# Patient Record
Sex: Female | Born: 1975 | Race: White | Hispanic: No | Marital: Married | Smoking: Never smoker
Health system: Southern US, Community
[De-identification: ages and names within clinical notes are randomized; demographics above are authoritative.]

## PROBLEM LIST (undated history)

## (undated) DIAGNOSIS — Z8619 Personal history of other infectious and parasitic diseases: Secondary | ICD-10-CM

## (undated) DIAGNOSIS — F329 Major depressive disorder, single episode, unspecified: Secondary | ICD-10-CM

## (undated) DIAGNOSIS — F419 Anxiety disorder, unspecified: Secondary | ICD-10-CM

## (undated) DIAGNOSIS — Z8489 Family history of other specified conditions: Secondary | ICD-10-CM

## (undated) DIAGNOSIS — F32A Depression, unspecified: Secondary | ICD-10-CM

## (undated) HISTORY — DX: Personal history of other infectious and parasitic diseases: Z86.19

---

## 1997-09-18 ENCOUNTER — Other Ambulatory Visit: Admission: RE | Admit: 1997-09-18 | Discharge: 1997-09-18 | Payer: Self-pay | Admitting: *Deleted

## 1998-11-04 ENCOUNTER — Other Ambulatory Visit: Admission: RE | Admit: 1998-11-04 | Discharge: 1998-11-04 | Payer: Self-pay | Admitting: Obstetrics and Gynecology

## 1999-12-20 ENCOUNTER — Emergency Department (HOSPITAL_COMMUNITY): Admission: EM | Admit: 1999-12-20 | Discharge: 1999-12-20 | Payer: Self-pay | Admitting: Emergency Medicine

## 2002-04-12 HISTORY — PX: CHOLECYSTECTOMY: SHX55

## 2002-08-10 ENCOUNTER — Inpatient Hospital Stay (HOSPITAL_COMMUNITY): Admission: AD | Admit: 2002-08-10 | Discharge: 2002-08-10 | Payer: Self-pay | Admitting: Obstetrics and Gynecology

## 2003-01-18 ENCOUNTER — Ambulatory Visit (HOSPITAL_COMMUNITY): Admission: RE | Admit: 2003-01-18 | Discharge: 2003-01-18 | Payer: Self-pay | Admitting: Obstetrics and Gynecology

## 2003-02-01 ENCOUNTER — Encounter: Payer: Self-pay | Admitting: Obstetrics and Gynecology

## 2003-02-01 ENCOUNTER — Ambulatory Visit (HOSPITAL_COMMUNITY): Admission: RE | Admit: 2003-02-01 | Discharge: 2003-02-01 | Payer: Self-pay | Admitting: Obstetrics and Gynecology

## 2003-03-14 ENCOUNTER — Inpatient Hospital Stay (HOSPITAL_COMMUNITY): Admission: AD | Admit: 2003-03-14 | Discharge: 2003-03-18 | Payer: Self-pay | Admitting: Obstetrics and Gynecology

## 2003-03-15 ENCOUNTER — Encounter (INDEPENDENT_AMBULATORY_CARE_PROVIDER_SITE_OTHER): Payer: Self-pay | Admitting: *Deleted

## 2003-04-22 ENCOUNTER — Other Ambulatory Visit: Admission: RE | Admit: 2003-04-22 | Discharge: 2003-04-22 | Payer: Self-pay | Admitting: Obstetrics and Gynecology

## 2003-05-24 ENCOUNTER — Encounter: Admission: RE | Admit: 2003-05-24 | Discharge: 2003-05-24 | Payer: Self-pay | Admitting: Family Medicine

## 2003-06-26 ENCOUNTER — Encounter (INDEPENDENT_AMBULATORY_CARE_PROVIDER_SITE_OTHER): Payer: Self-pay | Admitting: Specialist

## 2003-06-26 ENCOUNTER — Observation Stay (HOSPITAL_COMMUNITY): Admission: RE | Admit: 2003-06-26 | Discharge: 2003-06-27 | Payer: Self-pay | Admitting: General Surgery

## 2004-06-08 ENCOUNTER — Other Ambulatory Visit: Admission: RE | Admit: 2004-06-08 | Discharge: 2004-06-08 | Payer: Self-pay | Admitting: Obstetrics and Gynecology

## 2004-09-29 ENCOUNTER — Emergency Department (HOSPITAL_COMMUNITY): Admission: EM | Admit: 2004-09-29 | Discharge: 2004-09-29 | Payer: Self-pay | Admitting: Emergency Medicine

## 2005-04-23 ENCOUNTER — Ambulatory Visit: Payer: Self-pay | Admitting: Family Medicine

## 2005-07-06 ENCOUNTER — Ambulatory Visit: Payer: Self-pay | Admitting: Family Medicine

## 2006-03-24 ENCOUNTER — Inpatient Hospital Stay (HOSPITAL_COMMUNITY): Admission: AD | Admit: 2006-03-24 | Discharge: 2006-03-27 | Payer: Self-pay | Admitting: Obstetrics and Gynecology

## 2006-06-28 ENCOUNTER — Ambulatory Visit: Payer: Self-pay | Admitting: Family Medicine

## 2009-04-26 ENCOUNTER — Inpatient Hospital Stay (HOSPITAL_COMMUNITY): Admission: AD | Admit: 2009-04-26 | Discharge: 2009-04-27 | Payer: Self-pay | Admitting: Obstetrics and Gynecology

## 2009-11-18 ENCOUNTER — Inpatient Hospital Stay (HOSPITAL_COMMUNITY)
Admission: RE | Admit: 2009-11-18 | Discharge: 2009-11-21 | Payer: Self-pay | Source: Home / Self Care | Admitting: Obstetrics and Gynecology

## 2010-06-26 LAB — CBC
HCT: 31.1 % — ABNORMAL LOW (ref 36.0–46.0)
HCT: 35.9 % — ABNORMAL LOW (ref 36.0–46.0)
Hemoglobin: 12.4 g/dL (ref 12.0–15.0)
MCH: 32.7 pg (ref 26.0–34.0)
MCH: 33.2 pg (ref 26.0–34.0)
MCHC: 34.7 g/dL (ref 30.0–36.0)
MCHC: 35.2 g/dL (ref 30.0–36.0)
MCV: 94.2 fL (ref 78.0–100.0)
Platelets: 135 10*3/uL — ABNORMAL LOW (ref 150–400)
RBC: 3.81 MIL/uL — ABNORMAL LOW (ref 3.87–5.11)
RDW: 13.3 % (ref 11.5–15.5)
RDW: 13.6 % (ref 11.5–15.5)
WBC: 8.3 10*3/uL (ref 4.0–10.5)

## 2010-06-26 LAB — SURGICAL PCR SCREEN
MRSA, PCR: NEGATIVE
Staphylococcus aureus: NEGATIVE

## 2010-06-26 LAB — TYPE AND SCREEN: Antibody Screen: NEGATIVE

## 2010-06-26 LAB — RPR: RPR Ser Ql: NONREACTIVE

## 2010-06-26 LAB — ABO/RH: ABO/RH(D): A POS

## 2010-06-28 LAB — COMPREHENSIVE METABOLIC PANEL
AST: 15 U/L (ref 0–37)
Albumin: 3.3 g/dL — ABNORMAL LOW (ref 3.5–5.2)
BUN: 13 mg/dL (ref 6–23)
Calcium: 8.8 mg/dL (ref 8.4–10.5)
Chloride: 105 mEq/L (ref 96–112)
Creatinine, Ser: 0.47 mg/dL (ref 0.4–1.2)
GFR calc Af Amer: >60 mL/min (ref >60)
Total Bilirubin: 1 mg/dL (ref 0.3–1.2)
Total Protein: 6.3 g/dL (ref 6.0–8.3)

## 2010-06-28 LAB — URINALYSIS, ROUTINE W REFLEX MICROSCOPIC
Bilirubin Urine: NEGATIVE
Glucose, UA: NEGATIVE mg/dL
Hgb urine dipstick: NEGATIVE
Specific Gravity, Urine: >1.03 — ABNORMAL HIGH (ref 1.005–1.030)
pH: 5 (ref 5.0–8.0)

## 2010-06-28 LAB — CBC
Hemoglobin: 11.9 g/dL — ABNORMAL LOW (ref 12.0–15.0)
MCHC: 34.1 g/dL (ref 30.0–36.0)
MCV: 92.7 fL (ref 78.0–100.0)
MCV: 93.5 fL (ref 78.0–100.0)
Platelets: 146 10*3/uL — ABNORMAL LOW (ref 150–400)
RBC: 3.73 MIL/uL — ABNORMAL LOW (ref 3.87–5.11)
RDW: 12.8 % (ref 11.5–15.5)
WBC: 10.3 10*3/uL (ref 4.0–10.5)
WBC: 11.2 10*3/uL — ABNORMAL HIGH (ref 4.0–10.5)

## 2010-08-27 ENCOUNTER — Encounter: Payer: Self-pay | Admitting: Family Medicine

## 2010-08-27 ENCOUNTER — Ambulatory Visit (INDEPENDENT_AMBULATORY_CARE_PROVIDER_SITE_OTHER): Payer: PRIVATE HEALTH INSURANCE | Admitting: Family Medicine

## 2010-08-27 ENCOUNTER — Ambulatory Visit: Payer: Self-pay | Admitting: Internal Medicine

## 2010-08-27 DIAGNOSIS — H6090 Unspecified otitis externa, unspecified ear: Secondary | ICD-10-CM | POA: Insufficient documentation

## 2010-08-27 DIAGNOSIS — H60399 Other infective otitis externa, unspecified ear: Secondary | ICD-10-CM

## 2010-08-27 DIAGNOSIS — Z0000 Encounter for general adult medical examination without abnormal findings: Secondary | ICD-10-CM | POA: Insufficient documentation

## 2010-08-27 MED ORDER — OFLOXACIN 0.3 % OT SOLN: 10.0000 [drp] | Freq: Every day | OTIC | Status: AC

## 2010-08-27 NOTE — Assessment & Plan Note (Signed)
Treat with otic ear drops.  If too expensive, would change to cipro HC or ciprodex.

## 2010-08-27 NOTE — Assessment & Plan Note (Addendum)
Reviewed preventive protocols. Due for tetanus next year. Return fasting for blood work. Gets well woman by Dr. Vincente Poli.

## 2010-08-27 NOTE — Patient Instructions (Signed)
You have outer ear infection. Treat with ibuprofen/advil or tylenol and ear drops (sent to pharmacy). Return at your convenience fasting for blood work. Good to meet you today. If worsening, we may want to take a look again.

## 2010-08-27 NOTE — Progress Notes (Signed)
  Subjective:    Patient ID: Brittney [name], female    DOB: 22-Jul-1975, 35 y.o.   MRN: [id number]  HPI CC: new patient, establish, ear pain  1 mo h/o ear irritation.  Started with muffled hearing.  Feels like something clogged in ears like water.  Now swollen and hurting.  Hurts to open mouth to eat.  Bilateral.  Pain to touch.  No draining or discharge.  + hearing changes both sides.  No tinnitus, n/v/dizziness.  Not recently swimming.  Never had ear infection in past.  Prevetative: Last tetanus 2003. No recent blood work. Well woman at [facility].  Last pap, breast 05/2010, normal.  Medications and allergies reviewed and updated in chart. PMHx, SurgHx, SHx and FMHx updated in chart.  Review of Systems  Constitutional: Negative for fever, chills, activity change, appetite change, fatigue and unexpected weight change.  HENT: Positive for hearing loss and ear pain. Negative for neck pain.   Eyes: Negative for visual disturbance.  Respiratory: Negative for cough, choking, chest tightness, shortness of breath and wheezing.   Cardiovascular: Negative for chest pain, palpitations and leg swelling.  Gastrointestinal: Negative for nausea, vomiting, abdominal pain, diarrhea, constipation, blood in stool and abdominal distention.  Genitourinary: Negative for hematuria and difficulty urinating.  Musculoskeletal: Negative for myalgias and arthralgias.  Skin: Negative for rash.  Neurological: Negative for dizziness, seizures, syncope and headaches.  Hematological: Does not bruise/bleed easily.  Psychiatric/Behavioral: Negative for dysphoric mood. The patient is not nervous/anxious.        Objective:   Physical Exam  Nursing note and vitals reviewed. Constitutional: She is oriented to person, place, and time. She appears well-developed and well-nourished. No distress.  HENT:  Head: Normocephalic and atraumatic.  Right Ear: Hearing and tympanic membrane normal. There is swelling  and tenderness. Tympanic membrane is not perforated.  Left Ear: Hearing and tympanic membrane normal. There is swelling and tenderness. Tympanic membrane is not perforated.  Nose: Nose normal.  Mouth/Throat: Oropharynx is clear and moist.       Dry skin external right pinna. Erythema/maceration bilateral external ear canals. Hearing intact. L cerumen impaction, disimpaction with curette performed.  Eyes: Conjunctivae and EOM are normal. Pupils are equal, round, and reactive to light.  Neck: Normal range of motion. Neck supple. No thyromegaly present.  Cardiovascular: Normal rate, regular rhythm, normal heart sounds and intact distal pulses.   No murmur heard. Pulses:      Radial pulses are 2+ on the right side, and 2+ on the left side.  Pulmonary/Chest: Effort normal and breath sounds normal. No respiratory distress. She has no wheezes. She has no rales.  Abdominal: Soft. Bowel sounds are normal. She exhibits no distension and no mass. There is no tenderness. There is no rebound and no guarding.  Musculoskeletal: Normal range of motion.  Lymphadenopathy:    She has no cervical adenopathy.  Neurological: She is alert and oriented to person, place, and time.       CN grossly intact, station and gait intact  Skin: Skin is warm and dry. No rash noted.  Psychiatric: She has a normal mood and affect. Her behavior is normal. Judgment and thought content normal.          Assessment & Plan:

## 2010-08-28 ENCOUNTER — Other Ambulatory Visit (INDEPENDENT_AMBULATORY_CARE_PROVIDER_SITE_OTHER): Payer: PRIVATE HEALTH INSURANCE | Admitting: Family Medicine

## 2010-08-28 DIAGNOSIS — Z0000 Encounter for general adult medical examination without abnormal findings: Secondary | ICD-10-CM

## 2010-08-28 DIAGNOSIS — Z13220 Encounter for screening for lipoid disorders: Secondary | ICD-10-CM

## 2010-08-28 DIAGNOSIS — H60399 Other infective otitis externa, unspecified ear: Secondary | ICD-10-CM

## 2010-08-28 DIAGNOSIS — H6090 Unspecified otitis externa, unspecified ear: Secondary | ICD-10-CM

## 2010-08-28 LAB — LDL CHOLESTEROL, DIRECT: Direct LDL: 155.5 mg/dL

## 2010-08-28 LAB — CBC WITH DIFFERENTIAL/PLATELET
Basophils Absolute: 0 10*3/uL (ref 0.0–0.1)
Eosinophils Relative: 1.4 % (ref 0.0–5.0)
HCT: 38.4 % (ref 36.0–46.0)
Hemoglobin: 13.1 g/dL (ref 12.0–15.0)
Lymphocytes Relative: 24.7 % (ref 12.0–46.0)
Lymphs Abs: 1.8 10*3/uL (ref 0.7–4.0)
Monocytes Relative: 7.5 % (ref 3.0–12.0)
Neutro Abs: 4.8 10*3/uL (ref 1.4–7.7)
Platelets: 161 10*3/uL (ref 150.0–400.0)
RDW: 13 % (ref 11.5–14.6)
WBC: 7.3 10*3/uL (ref 4.5–10.5)

## 2010-08-28 LAB — TSH: TSH: 1.88 u[IU]/mL (ref 0.35–5.50)

## 2010-08-28 LAB — LIPID PANEL
Cholesterol: 203 mg/dL — ABNORMAL HIGH (ref 0–200)
Total CHOL/HDL Ratio: 5
VLDL: 34.8 mg/dL (ref 0.0–40.0)

## 2010-08-28 LAB — BASIC METABOLIC PANEL
CO2: 27 mEq/L (ref 19–32)
Chloride: 104 mEq/L (ref 96–112)
Potassium: 4.6 mEq/L (ref 3.5–5.1)

## 2010-08-28 NOTE — Op Note (Signed)
[name], [name]                       ACCOUNT NO.:  [id number]   MEDICAL RECORD NO.:  [id number]                   PATIENT TYPE:  OBV   LOCATION:  0278                                 FACILITY:  [facility]   PHYSICIAN:  Gita Kudo, M.D.              DATE OF BIRTH:  1976-03-21   DATE OF PROCEDURE:  06/26/2003  DATE OF DISCHARGE:  06/27/2003                                 OPERATIVE REPORT   OPERATIVE PROCEDURE:  Laparoscopic cholecystectomy, unsuccessful attempt at  [facility].   SURGEON:  Gita Kudo, M.D.   ASSISTANT:  Currie Paris, M.D.   ANESTHESIA:  General endotracheal.   PREOPERATIVE DIAGNOSIS:  Multiple small gallstones, normal liver function  studies.   POSTOPERATIVE DIAGNOSES:  1. Multiple small gallstones, normal liver function studies.  2. Very small cystic duct.   CLINICAL SUMMARY:  A 35 year old female with symptoms of abdominal pain in  the right upper quadrant.  Gallbladder ultrasound shows stones.  Liver  function studies are normal.   OPERATIVE FINDINGS:  The patient was very heavy, and exposure was somewhat  difficult, but very good eventually.  Her liver was enlarged and had fatty  deposits.  The gallbladder itself looked normal.  The cystic duct was quite  small, and I could not cannulate it for x-ray.   OPERATIVE PROCEDURE:  Under satisfactory general endotracheal anesthesia,  the patient's abdomen was prepped and draped in a standard fashion.  She  received 1.0 g Ancef preop.  A total of 30 mL of 0.5% Marcaine with  epinephrine was infiltrated before the skin incisions were made.  Transverse  incision made above the umbilicus and the deep retractor was used to  identify the fascia which was opened into the peritoneum.  Midline  controlled with a figure-of-eight 0 Vicryl suture and operating Hasson port  inserted, secured.  Good CO2 pneumoperitoneum was established and then  camera placed.  Under direct visualization, two #5 ports placed  laterally  and a second #10 medially.  With exposure being afforded by lateral  graspers, operating through the medial port, I carefully dissected the  distal gallbladder cystic duct junction.  When certain of the anatomy by  circumferentially dissection, a clip placed near the gallbladder.  An  incision made in the cystic duct, and attempts to pass a percutaneous  catheter were unsuccessful and cholangiogram not obtained.  The duct was  controlled with multiple clips and divided.  Likewise, the artery was  divided between multiple clips and divided.  Then, the gallbladder was  dissected from below upward.  Another large posterior artery to the  gallbladder was identified and controlled with clips and divided, and the  gallbladder was removed from the liver bed with the cautery clip for  dissection and hemostasis.  A small hole made in the gallbladder, and clear  bile came out.  No stones.  The was placed in an EndoCatch bag after the  dissection, and the remainder of that liter of saline used to irrigate the  operative site and abdomen.   Camera then moved to the upper port and a grasper in the lower port through  the umbilicus was used to extract the gallbladder intact and without further  spillage or problem.  Then, a second liter of saline was used to irrigate  the operative site, suctioned away.   The ports were removed under direct vision.  The operative site checked for  hemostasis which was good.  There were no apparent complications, and the  patient went to the recovery room from the operating room in good condition  without complication.                                               Gita Kudo, M.D.    MRL/MEDQ  D:  06/26/2003  T:  06/27/2003  Job:  [id number]   cc:   L. Lupe Carney, M.D.  [address] [place]  [place] [address]  Fax: [phone]

## 2010-08-28 NOTE — Op Note (Signed)
[name], Brittney [name]             ACCOUNT NO.:  [id number]   MEDICAL RECORD NO.:  [id number]          PATIENT TYPE:  INP   LOCATION:  9130                          FACILITY:  [facility]   PHYSICIAN:  Michelle L. Grewal, M.D.DATE OF BIRTH:  [birth year]   DATE OF PROCEDURE:  03/24/2006  DATE OF DISCHARGE:                               OPERATIVE REPORT   PREOPERATIVE DIAGNOSIS:  Intrauterine pregnancy at 39 weeks and previous  cesarean section.   POSTOPERATIVE DIAGNOSIS:  Intrauterine pregnancy at 39 weeks and  previous cesarean section.   PROCEDURE:  Repeat low transverse cesarean section.   SURGEON:  Michelle L. Vincente Poli, M.D.   ANESTHESIA:  Spinal and epidural.   SPECIMEN:  Female infant, Apgars 9 at 1 minute and 9 at 5 minutes.   ESTIMATED BLOOD LOSS:  500 mL.   COMPLICATIONS:  None.   DESCRIPTION OF PROCEDURE:  The patient was taken to the operating room.  Her epidural and spinal were placed by Dr. Jean Rosenthal.  She is prepped and  draped in the usual sterile fashion.  A Foley catheter is inserted. A  low transverse incision was made and carried down to the fascia.  The  fascia was scored in the midline and extended laterally.  The rectus  muscles were separated in the midline.  The peritoneum was entered  bluntly.  The peritoneal incision was then stretched.  The bladder blade  was inserted and the lower uterine segment was identified. The bladder  flap was created sharply and then digitally.  The bladder blade was then  readjusted. A low transverse incision was made in the uterus.  The  uterus was entered using a hemostat.  The baby was in cephalic  presentation and was delivered with the assistance of the vacuum  extractor without difficulty.  She was a female infant, Apgar 9 at 1  minute and 9 at 5 minutes.  The cord was clamped and cut. The baby was  handed to the awaiting pediatrician.  The placenta was manually removed  and noted be normal, intact, with a three vessel cord.   The uterus was  exteriorized and cleared of all clots and debris.  The uterine incision  was closed in one layer using 0 chromic in a continuous running locked  stitch.  Hemostasis was excellent.  The uterus was returned to the  abdomen.  Irrigation was performed.  Hemostasis was again excellent.  The peritoneum and rectus muscles were reapproximated using 0 Vicryl.  The fascia was closed using 0 Vicryl in a running stitch starting at  each corner and meeting in the midline.  After irrigation of the subcutaneous layer, the subcutaneous layer was  closed with 0 plain gut interrupted and the skin was closed with  staples.  All sponge, lap and instrument counts were correct x2.  The  patient went to recovery room in stable condition.      Michelle L. Vincente Poli, M.D.  Electronically Signed     MLG/MEDQ  D:  03/24/2006  T:  03/24/2006  Job:  [id number]

## 2010-08-28 NOTE — Discharge Summary (Signed)
Brittney [name], Brittney [name]             ACCOUNT NO.:  [id number]   MEDICAL RECORD NO.:  [id number]          PATIENT TYPE:  INP   LOCATION:  9130                          FACILITY:  [facility]   PHYSICIAN:  Guy Sandifer. Henderson Cloud, M.D. DATE OF BIRTH:  July 23, 1975   DATE OF ADMISSION:  03/24/2006  DATE OF DISCHARGE:  03/27/2006                               DISCHARGE SUMMARY   ADMITTING DIAGNOSES:  1. Intrauterine pregnancy.  2. Previous cesarean section, desires repeat.   DISCHARGE DIAGNOSES:  1. Intrauterine pregnancy at 39 weeks.  2. Previous cesarean section, desires repeat.  3. Postpartum depression.   REASON FOR ADMISSION:  The patient is 35 year old married white female  G2 P1 with an EDC of April 03, 2006 who has had a previous cesarean  section.  After discussion of the options, she is admitted for repeat.  Prenatal care was also complicated by history of anxiety and depression  treated with Zoloft in the past.  The patient states she has been off  Zoloft for essentially the entire pregnancy.   HOSPITAL COURSE:  The patient is admitted to hospital, taken to  operating room, undergoes the above procedure.  It is productive of a  viable female infant, Apgars of 9 and 9 at one and five minutes  respectively.  The first postoperative day vital signs are stable and  she is afebrile.  Hemoglobin is 11.4, white count 9.3, platelets  159,000.  She has a good resumption of bowel function and has good pain  control.  She does notice the return of some sadness and teariness  consistent with her history of postpartum depression.  No thoughts of  harming herself or the baby.  This happened with her previous pregnancy.  Options are carefully discussed with the patient and she will resume  Zoloft 50 mg daily at home.  She has the prescription at home.   CONDITION ON DISCHARGE:  Good.   DIET:  Regular as tolerated.   ACTIVITY:  No lifting, no operation of automobiles, no vaginal entry.   FOLLOW  UP:  She is to follow up in the office in 2 weeks.   MEDICATIONS:  1. Percocet 5/325 mg #40 one to two p.o. q.6 h. p.r.n.  2. Ibuprofen 600 mg q.6 h. p.r.n.  3. Prenatal vitamin daily.  4. Zoloft 50 mg daily.      Guy Sandifer Henderson Cloud, M.D.  Electronically Signed     JET/MEDQ  D:  03/27/2006  T:  03/27/2006  Job:  [id number]

## 2010-08-28 NOTE — Op Note (Signed)
[name], [name]                       ACCOUNT NO.:  [id number]   MEDICAL RECORD NO.:  [id number]                   PATIENT TYPE:  INP   LOCATION:  9126                                 FACILITY:  [facility]   PHYSICIAN:  Dineen Kid. Rana Snare, M.D.                 DATE OF BIRTH:  [birth year]   DATE OF PROCEDURE:  03/16/2003  DATE OF DISCHARGE:                                 OPERATIVE REPORT   PREOPERATIVE DIAGNOSES:  1. Intrauterine pregnancy at 38-1/2 weeks.  2. Presumed macrosomia.  3. Failure to progress.  4. Fetal intolerance to labor.   POSTOPERATIVE DIAGNOSES:  1. Intrauterine pregnancy at 38-1/2 weeks.  2. Presumed macrosomia.  3. Failure to progress.  4. Fetal intolerance to labor.  5. Occiput posterior presentation.   PROCEDURE:  Primary low segment transverse cesarean section.   SURGEON:  Dineen Kid. Rana Snare, M.D.   ANESTHESIA:  Epidural.   INDICATIONS:  Brittney [name] is a 35 year old G1 at 38-1/2 weeks who presented  with spontaneous rupture of membranes in early labor.  She progressed to 4  cm dilated and despite Pitocin was unable to achieve adequate labor pattern,  progressed only to 4 cm, 75% effaced, and -2 station.  The patient did not  tolerate labor and after approximately five to six hours of 4 cm dilated  unable to achieve adequate contractions despite 28 milliunits of Pitocin per  minute.  Due to fetal intolerance to labor, proceeded wit primary low  segment transverse cesarean section.  Risks and benefits were discussed,  informed consent was obtained.   The findings at the time of surgery were a viable female infant, Apgars were  9 and 9.  The pH arterial was 7.32 and the weight was 8 pounds 6 ounces.   DESCRIPTION OF PROCEDURE:  After adequate analgesia, the patient was placed  in the supine position with left lateral tilt.  She was sterilely prepped  and draped.  The bladder was sterilely drained with a Foley catheter.  A  Pfannenstiel skin incision was made  two fingerbreadths over the pubic  symphysis, taken down sharply to the fascia, incised transversely, and  extended superiorly and inferiorly of the bellies of the rectus muscles.  The peritoneum was entered sharply.  A bladder flap was created, placed  behind the bladder blade.  A low segment myotomy incision was made down to  the infant's vertex, extended laterally with the operator's fingertips.  The  infant's vertex was delivered atraumatically.  The nares and pharynx were  suctioned.  The infant was then delivered, the cord clamped and cut, and  handed to the pediatricians for resuscitation.  Cord blood was entertained,  the placenta extracted manually.  The uterus was exteriorized, wiped clean  with a dry lap.  The myotomy incision was closed in two layers, the first  being a running locking layer, the second being an imbricating layer of 0  Monocryl  suture.  The uterus was placed back in the peritoneal cavity and  after a copious amount of irrigation, adequate hemostasis was assured.  The  peritoneum was closed with 0 Monocryl in a running fashion, the rectus  muscles plicated in the midline.  Irrigation was applied and after adequate  hemostasis, the fascia was closed with a single suture of double-stranded 0  PDS in a running fashion with good approximation and good hemostasis.  Irrigation was applied and after adequate hemostasis, the  Camper's fascia  was reapproximated with interrupted sutures of 0 plain, staples were then  applied.  The skin was approximated with staples with good approximation and  good hemostasis.  The patient tolerated the procedure well, was stable on  transfer to the recovery room.  The sponge, instrument, and needle count was  normal x3.  The patient received 2 g of Cefotetan after delivery of the  placenta and will stay on this for 24 hours due to her size.  The estimated  blood loss was 800 mL.                                               Dineen Kid  Rana Snare, M.D.    DCL/MEDQ  D:  03/15/2003  T:  03/16/2003  Job:  [id number]

## 2010-08-28 NOTE — Discharge Summary (Signed)
Brittney [name]                       ACCOUNT NO.:  [id number]   MEDICAL RECORD NO.:  [id number]                   PATIENT TYPE:  INP   LOCATION:  9126                                 FACILITY:  [facility]   PHYSICIAN:  Guy Sandifer. Henderson Cloud, M.D.              DATE OF BIRTH:  [birth year]   DATE OF ADMISSION:  03/14/2003  DATE OF DISCHARGE:  03/18/2003                                 DISCHARGE SUMMARY   ADMITTING DIAGNOSES:  1. Intrauterine pregnancy at 77 weeks estimated gestational age.  2. Spontaneous rupture of membranes.   DISCHARGE DIAGNOSES:  1. Status post low transverse cesarean section secondary to macrosomia.  2. Failure to progress.  3. Fetal intolerance to labor.  4. Viable female infant.   PROCEDURE:  Primary low transverse cesarean section.   REASON FOR ADMISSION:  Please see written H&P.   HOSPITAL COURSE:  The patient was a 35 year old white married female  primigravida that was admitted to [facility] after  spontaneous rupture of membranes at term.  The patient was previously  scheduled for a two-stage induction for macrosomia.  Cervix was noted to be  2-3 cm dilated, vertex, at a -4 station.  IV Pitocin was started with  anticipation of a vaginal delivery.  Epidural was later placed for the  patient's comfort.  Vital signs were stable.  Fetal heart tones were noted  in the 140s with no deceleration.  Contractions were noted to be every 2-3  minutes.  Cervix did progress to 4 cm dilated, 75% effaced, at a -2 station.  After several hours of labor the patient's cervix was unchanged.  Fetal  heart tones were noted to have late decelerations which were repetitive  which did respond to discontinuation of Pitocin, oxygen, and increased IV  fluids.  Due to fetal intolerance of labor and no further change in cervical  dilatation a decision was made to proceed with a cesarean delivery.  The  patient was then transferred to the operating room where epidural  was dosed  to an adequate surgical level.  A low transverse incision was made with the  delivery of a viable female infant weighing 8 pounds 6 ounces with Apgars of  9 at one minute and 9 at five minutes.  Umbilical cord pH was 7.32.  The  patient tolerated the procedure well and was taken to the recovery room in  stable condition.  On postoperative day #1 vital signs were stable with good  return of bowel function.  Fundus was firm and nontender.  Abdominal  dressing was clean, dry, and intact.  Labs revealed hemoglobin of 11.4.  On  postoperative day #2 the patient was without complaint.  Vital signs were  stable; she remained afebrile.  Fundus was firm and nontender.  Abdominal  dressing had been removed revealing an incision that was clean, dry, and  intact.  On postoperative day #3 the patient did complain of  some low back  pain.  She denied nausea and vomiting.  She had good bowel function.  The  patient was catheterized for urinalysis and C&S which subsequently was  negative.  Abdomen was soft, fundus was firm, incision was clean, dry, and  intact.  Staples were removed and the patient was discharged home.   CONDITION ON DISCHARGE:  Good.   DIET:  Regular as tolerated.   ACTIVITY:  No heavy lifting, no driving x2 weeks, no vaginal entry.   FOLLOW-UP:  The patient is to follow up in the office in one week for an  incision check.  She is to call for temperature greater than 100 degree,  persistent nausea and vomiting, heavy vaginal bleeding, and/or redness or  drainage from the incisional site.   DISCHARGE MEDICATIONS:  1. Tylox #30 one p.o. q.4-6h. p.r.n. pain.  2. Motrin 600 mg q.6h. p.r.n.  3. Prenatal vitamins one p.o. daily.  4. Colace one p.o. daily p.r.n.     Julio Sicks, N.P.                        Guy Sandifer. Henderson Cloud, M.D.    CC/MEDQ  D:  04/16/2003  T:  04/16/2003  Job:  [id number]

## 2011-10-20 ENCOUNTER — Ambulatory Visit (INDEPENDENT_AMBULATORY_CARE_PROVIDER_SITE_OTHER): Payer: PRIVATE HEALTH INSURANCE | Admitting: Family Medicine

## 2011-10-20 ENCOUNTER — Encounter: Payer: Self-pay | Admitting: Family Medicine

## 2011-10-20 VITALS — BP 118/80 | HR 103 | Temp 98.4°F | Ht 63.0 in | Wt 300.8 lb

## 2011-10-20 DIAGNOSIS — M722 Plantar fascial fibromatosis: Secondary | ICD-10-CM

## 2011-10-20 NOTE — Patient Instructions (Addendum)

## 2011-10-20 NOTE — Progress Notes (Signed)
The patient presents with a 2-3 week long history of heel pain. This is notable for worsening pain first thing in the morning when arising and standing after sitting. Heel has really sharpt pains in it. First thing in the morning, will hurt really bad.   Prior foot or ankle fractures: none Prior operations: none Orthotics or bracing: none Medications: none PT or home rehab: none Night splints: no Ice massage: no Ball massage: no  Metatarsal pain: no  The PMH, PSH, Social History, Family History, Medications, and allergies have been reviewed in [facility], and have been updated if relevant.  REVIEW OF SYSTEMS  GEN: No fevers, chills. Nontoxic. Primarily MSK c/o today. MSK: Detailed in the HPI GI: tolerating PO intake without difficulty Neuro: No numbness, parasthesias, or tingling associated. Otherwise the pertinent positives of the ROS are noted above.   PHYSICAL EXAM  Blood pressure 118/80, pulse 103, temperature 98.4 F (36.9 C), temperature source Oral, height 5\' 3"  (1.6 m), weight 300 lb 12 oz (136.419 kg), SpO2 96.00%.  GEN: Well-developed,well-nourished,in no acute distress; alert,appropriate and cooperative throughout examination HEENT: Normocephalic and atraumatic without obvious abnormalities. Ears, externally no deformities PULM: Breathing comfortably in no respiratory distress EXT: No clubbing, cyanosis, or edema PSYCH: Normally interactive. Cooperative during the interview. Pleasant. Friendly and conversant. Not anxious or depressed appearing. Normal, full affect.  LEFT FOOT Echymosis: no Edema: no ROM: full LE B Gait: heel toe, non-antalgic MT pain: no Callus pattern: none Lateral Mall: NT Medial Mall: NT Talus: NT Navicular: NT Calcaneous: NT Metatarsals: NT 5th MT: NT Phalanges: NT Achilles: NT Plantar Fascia: tender, medial along PF. Pain with forced dorsi Fat Pad: NT Peroneals: NT Post Tib: NT Great Toe: Nml motion Ant Drawer: neg Other foot  breakdown: none Long arch: preserved Transverse arch: preserved Hindfoot breakdown: none Sensation: intact  A/P: Plantar fascitis:  >25 minutes spent in face to face time with patient, >50% spent in counselling or coordination of care: anatomy and care and rehab reviewed. We reviewed that stretching is critically important to the treatment of PF. Reviewed footwear. Rigid soles have been shown to help with PF.  Refer to the patient instructions sections for details of plan shared with patient.

## 2011-12-28 ENCOUNTER — Ambulatory Visit (INDEPENDENT_AMBULATORY_CARE_PROVIDER_SITE_OTHER): Payer: PRIVATE HEALTH INSURANCE | Admitting: Family Medicine

## 2011-12-28 ENCOUNTER — Encounter: Payer: Self-pay | Admitting: Family Medicine

## 2011-12-28 VITALS — BP 126/84 | HR 72 | Temp 97.9°F | Wt 296.2 lb

## 2011-12-28 DIAGNOSIS — H5789 Other specified disorders of eye and adnexa: Secondary | ICD-10-CM | POA: Insufficient documentation

## 2011-12-28 MED ORDER — OLOPATADINE HCL 0.1 % OP SOLN: 1.0000 [drp] | Freq: Two times a day (BID) | OPHTHALMIC | Status: DC

## 2011-12-28 MED ORDER — CIPROFLOXACIN HCL 0.3 % OP SOLN: OPHTHALMIC | Status: DC

## 2011-12-28 NOTE — Patient Instructions (Addendum)
I am somewhat worried about superficial scratch of eye. No contacts or makeup. Treat with over the counter lubricating eye drops and antibiotic eye drop - sent to pharmacy. Don't use patanol drops. Return tomorrow for recheck.  Corneal Abrasion The cornea is the clear covering at the front and center of the eye. When looking at the colored portion (iris) of the eye, you are looking through that person's cornea.  This very thin tissue is made up of many layers. The surface layer is a single layer of cells called the corneal epithelium. This is one of the most sensitive tissues in the body. If a scratch or injury causes the corneal epithelium to come off, it is called a corneal abrasion. If the injury extends to the tissues below the epithelium, the condition is called a corneal ulcer.  CAUSES   Scratches.   Trauma.   Foreign body in the eye.   Some people have recurrences of abrasions in the area of the original injury even after they heal. This is called recurrent erosion syndrome. Recurrent erosion syndromes generally improve and go away with time.  SYMPTOMS   Eye pain.   Difficulty or inability to keep the injured eye open.   The eye becomes very sensitive to light.   Recurrent erosions tend to happen suddenly, first thing in the morning - usually upon awakening and opening the eyes.  DIAGNOSIS  Your eye professional can diagnose a corneal abrasion during an eye exam. Dye is usually placed in the eye using a drop or a small paper strip moistened by the patient's tears. When the eye is examined with a special light, the abrasion shows up clearly because of the dye. TREATMENT   Small abrasions may be treated with antibiotic drops or ointment alone.   Usually a pressure patch is specially applied. Pressure patches prevent the eye from blinking, allowing the corneal epithelium to heal. Because blinking is less, a pressure patch also reduces the amount of pain present in the eye during  healing. Most corneal abrasions heal within 2-3 days with no effect on vision. WARNING: Do not drive or operate machinery while your eye is patched. Your ability to judge distances is impaired.   If abrasion becomes infected and spreads to the deeper tissues of the cornea, a corneal ulcer can result. This is serious because it can cause corneal scarring. Corneal scars interfere with light passing through the cornea, and cause a loss of vision in the involved eye.   If your caregiver has given you a follow-up appointment, it is very important to keep that appointment. Not keeping the appointment could result in a severe eye infection or permanent loss of vision. If there is any problem keeping the appointment, you must call back to this facility for assistance.  SEEK MEDICAL CARE IF:   You have pain, light sensitivity and a scratchy feeling in one eye (or both).   Your pressure patch keeps loosening up and you can blink your eye under the patch after treatment.   Any kind of discharge develops from the involved eye after treatment or if the lids stick together in the morning.   You have the same symptoms in the morning as you did with the original abrasion days, weeks or months after the abrasion healed.  MAKE SURE YOU:   Understand these instructions.   Will watch your condition.   Will get help right away if you are not doing well or get worse.  Document Released: 03/26/2000  Document Revised: 03/18/2011 Document Reviewed: 11/02/2007 [facility] Patient Information 2012 [place], [place].

## 2011-12-28 NOTE — Assessment & Plan Note (Signed)
Anticipate R allergic conjunctivitis, however with + flurescein cannot r/o corneal abrasion in contact wearer. Treat with cipro drops for next 5 days, rtc tomorrow for recheck.  If not improving, will refer to ophtho. Pt agrees with plan. Lubricating eye drops recommended OTC.  See pt instructions.

## 2011-12-28 NOTE — Progress Notes (Signed)
  Subjective:    Patient ID: Brittney [name], female    DOB: May 28, 1975, 36 y.o.   MRN: [id number]  HPI CC: R red eye  3-4 d h/o R red eye.  Started when working in yard.  Had contacts in.  Had itchy eye, scratched it.  Wore contacts all day next 2 days, then removed them.  Wore makeup yesterday which seemed to make issue worse.  No vision changes.  No pain with eye movements.  No fevers/chills, congestion, viral sxs, sneezing, RN. No other sick contacts with red eyes at home.  No h/o allergies.  Past Medical History  Diagnosis Date  . History of chicken pox      Review of Systems Per HPI    Objective:   Physical Exam  Nursing note and vitals reviewed. Constitutional: She appears well-developed and well-nourished. No distress.  Eyes: EOM and lids are normal. Pupils are equal, round, and reactive to light. Right conjunctiva is injected. Right conjunctiva has no hemorrhage. Left conjunctiva is not injected. Left conjunctiva has no hemorrhage. Right eye exhibits normal extraocular motion and no nystagmus. Left eye exhibits normal extraocular motion and no nystagmus.  Slit lamp exam:      The right eye shows fluorescein uptake.       Right bulbar conjunctiva injected, erythematous. + fluorescein uptake right eye at lateral limbus. Swollen conjunctiva noted there.       Assessment & Plan:

## 2012-03-06 ENCOUNTER — Other Ambulatory Visit: Payer: Self-pay | Admitting: Obstetrics and Gynecology

## 2012-03-14 ENCOUNTER — Telehealth: Payer: Self-pay | Admitting: Family Medicine

## 2012-03-14 NOTE — Telephone Encounter (Signed)
Patient Information:  Caller Name: Brittney [name]  Phone: [phone]  Patient: Brittney [name], Brittney [name]  Gender: Female  DOB: 1975-08-25  Age: 36 Years  PCP: Brittney [name] [facility])  Pregnant: No   Symptoms  Reason For Call & Symptoms: plantar fascitis; seen by Dr. Patsy Lager, and did receive cortisone in the past which helped.  Reviewed Health History In EMR: Yes  Reviewed Medications In EMR: Yes  Reviewed Allergies In EMR: Yes  Reviewed Surgeries / Procedures: Yes  Date of Onset of Symptoms: Unknown OB:  LMP: Unknown  Guideline(s) Used:  Foot Pain  Disposition Per Guideline:   See Within 3 Days in Office  Reason For Disposition Reached:   Moderate pain (e.g., interferes with normal activities, limping) and present > 3 days  Advice Given:  N/A  Office Follow Up:  Does the office need to follow up with this patient?: No  Instructions For The Office: N/A  Appointment Scheduled:  03/16/2012 08:15:00 Appointment Scheduled Provider:  Hannah Beat (Family Practice)  RN Note:  Patient prefers to get cortisone injection in the office.  Denies new swelling or redness of the foot.  States received cortisone shot about 3 months ago.  Seen by Dr. Patsy Lager in July 2013 and given foot exercises to do.  Per protocol, emergent symptoms denied; advised appt within 3 days.  Appt scheduled 03/512 0815 with Dr. Patsy Lager.  krs/can

## 2012-03-16 ENCOUNTER — Ambulatory Visit (INDEPENDENT_AMBULATORY_CARE_PROVIDER_SITE_OTHER): Payer: PRIVATE HEALTH INSURANCE | Admitting: Family Medicine

## 2012-03-16 ENCOUNTER — Encounter: Payer: Self-pay | Admitting: Family Medicine

## 2012-03-16 VITALS — BP 120/78 | HR 93 | Temp 97.9°F | Ht 63.0 in | Wt 301.0 lb

## 2012-03-16 DIAGNOSIS — M722 Plantar fascial fibromatosis: Secondary | ICD-10-CM

## 2012-03-16 NOTE — Progress Notes (Signed)
[facility] at [facility] [address] [place] [address] Phone: [phone] Fax: [phone]  Date:  03/16/2012   Name:  Brittney [name]   DOB:  February 10, 1976   MRN:  [id number] Gender: female Age: 36 y.o.  PCP:  Eustaquio Boyden, MD  Evaluating MD: Hannah Beat, MD   Chief Complaint: Foot Pain   History of Present Illness:  Brittney [name] is a 36 y.o. pleasant patient who presents with the following:  Left PF: PF ongoing for 6 months on the L, no trauma or accident. Has been doing some rehab, stretching. Weight = 300 lbs. Had corticosteroid injection 3 months ago from podiatry which helped until just now.   Patient Active Problem List  Diagnosis  . Routine general medical examination at a health care facility  . Otitis external  . Redness of right eye    Past Medical History  Diagnosis Date  . History of chicken pox     Past Surgical History  Procedure Date  . Cholecystectomy 2004  . Cesarean section     x3 (2004, 2007, 2011)    History  Substance Use Topics  . Smoking status: Never Smoker   . Smokeless tobacco: Never Used  . Alcohol Use: Yes     Comment: Rare    Family History  Problem Relation Age of Onset  . Diabetes Sister   . Polycystic ovary syndrome Sister   . Hypertension Sister   . Cancer Maternal Grandfather 46    prostate, stomach  . Stroke Paternal Grandmother   . Coronary artery disease Neg Hx     No Known Allergies  Medication list has been reviewed and updated.  Outpatient Prescriptions Prior to Visit  Medication Sig Dispense Refill  . [DISCONTINUED] ciprofloxacin (CILOXAN) 0.3 % ophthalmic solution Administer 1 drop, every 2 hours, while awake, for 1 day. Then 1 drop, every 4 hours, while awake, for the next 5 days.  5 mL  0   Last reviewed on 03/16/2012  8:16 AM by Consuello Masse, CMA  Review of Systems:   GEN: No fevers, chills. Nontoxic. Primarily MSK c/o today. MSK: Detailed in the HPI GI:  tolerating PO intake without difficulty Neuro: No numbness, parasthesias, or tingling associated. Otherwise the pertinent positives of the ROS are noted above.    Physical Examination: Filed Vitals:   03/16/12 0816  BP: 120/78  Pulse: 93  Temp: 97.9 F (36.6 C)  TempSrc: Oral  Height: 5\' 3"  (1.6 m)  Weight: 301 lb (136.533 kg)  SpO2: 97%    Body mass index is 53.32 kg/(m^2). Ideal Body Weight: Weight in (lb) to have BMI = 25: 140.8    GEN: WDWN, NAD, Non-toxic, Alert & Oriented x 3 HEENT: Atraumatic, Normocephalic.  Ears and Nose: No external deformity. EXTR: No clubbing/cyanosis/edema NEURO: Normal gait.  PSYCH: Normally interactive. Conversant. Not depressed or anxious appearing.  Calm demeanor.   Foot exam, LEFT Echymosis: no Edema: no ROM: full LE B Gait: heel toe, non-antalgic MT pain: no Callus pattern: none Lateral Mall: NT Medial Mall: NT Talus: NT Navicular: NT Calcaneous: NT Metatarsals: NT 5th MT: NT Phalanges: NT Achilles: NT Plantar Fascia: tender, medial along PF. Pain with forced dorsi Fat Pad: NT Peroneals: NT Post Tib: NT Great Toe: Nml motion Ant Drawer: neg Other foot breakdown: none Long arch: preserved Transverse arch: preserved Hindfoot breakdown: none Sensation: intact   Assessment and Plan:  1. Plantar fascia syndrome    Reviewed classic rehab  Plantar Fascitis Injection Verbal consent obtained. Risks including risk of rupture, hypopigmentation, and infection reviewed in addition to benefits and alternatives were reviewed. Chloraprep used for prep. Ethyl Chloride used for anesthesia. Under sterile conditions, using the medial approach 1 cc of Lidocaine 1% and 1 cc of Depo-Medrol 40 mg injected superior to plantar fascia and fanned. No compications. Decreased pain post-injection.   Orders Today:  No orders of the defined types were placed in this encounter.    Updated Medication List: (Includes new medications, updates to  list, dose adjustments) No orders of the defined types were placed in this encounter.    Medications Discontinued: Medications Discontinued During This Encounter  Medication Reason  . ciprofloxacin (CILOXAN) 0.3 % ophthalmic solution Error     Hannah Beat, MD

## 2012-06-28 ENCOUNTER — Ambulatory Visit (INDEPENDENT_AMBULATORY_CARE_PROVIDER_SITE_OTHER): Payer: PRIVATE HEALTH INSURANCE | Admitting: Family Medicine

## 2012-06-28 ENCOUNTER — Encounter: Payer: Self-pay | Admitting: Family Medicine

## 2012-06-28 VITALS — BP 116/78 | HR 94 | Temp 99.0°F | Ht 63.0 in | Wt 276.8 lb

## 2012-06-28 DIAGNOSIS — J029 Acute pharyngitis, unspecified: Secondary | ICD-10-CM | POA: Insufficient documentation

## 2012-06-28 LAB — POCT RAPID STREP A (OFFICE): Rapid Strep A Screen: NEGATIVE

## 2012-06-28 MED ORDER — GUAIFENESIN-CODEINE 100-10 MG/5ML PO SYRP: 5.0000 mL | ORAL_SOLUTION | Freq: Every evening | ORAL | Status: DC | PRN

## 2012-06-28 NOTE — Assessment & Plan Note (Signed)
RST negative. Anticipate viral - discussed this as well as supportive care Treat with ibuprofen, cheratussin for cough. If tightness sensation not better with ibuprofen and cough suppression, treat with prednisone course. Pt agrees with plan, aware to call if no better.
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
## 2012-06-28 NOTE — Patient Instructions (Addendum)
You have pharyngitis. Push fluids and plenty of rest. May use ibuprofen 400-600mg  up to three times a day for throat inflammation (take with food). Salt water gargles. Suck on cold things like popsicles or warm things like herbal teas (whichever soothes the throat better). Return if fever >101.5, worsening pain, or trouble opening/closing mouth, or hoarse voice. Good to see you today, call clinic with questions. Cough medication at night. If not better with above, call me for steroid course.

## 2012-08-06 ENCOUNTER — Encounter (HOSPITAL_BASED_OUTPATIENT_CLINIC_OR_DEPARTMENT_OTHER): Payer: Self-pay | Admitting: *Deleted

## 2012-08-06 ENCOUNTER — Emergency Department (HOSPITAL_BASED_OUTPATIENT_CLINIC_OR_DEPARTMENT_OTHER): Payer: PRIVATE HEALTH INSURANCE

## 2012-08-06 ENCOUNTER — Emergency Department (HOSPITAL_BASED_OUTPATIENT_CLINIC_OR_DEPARTMENT_OTHER)
Admission: EM | Admit: 2012-08-06 | Discharge: 2012-08-06 | Disposition: A | Discharge status: Home / Self Care | Payer: PRIVATE HEALTH INSURANCE | Attending: Emergency Medicine | Admitting: Emergency Medicine

## 2012-08-06 DIAGNOSIS — R112 Nausea with vomiting, unspecified: Secondary | ICD-10-CM | POA: Insufficient documentation

## 2012-08-06 DIAGNOSIS — R0789 Other chest pain: Secondary | ICD-10-CM | POA: Insufficient documentation

## 2012-08-06 DIAGNOSIS — Z79899 Other long term (current) drug therapy: Secondary | ICD-10-CM | POA: Insufficient documentation

## 2012-08-06 DIAGNOSIS — R000 Tachycardia, unspecified: Secondary | ICD-10-CM | POA: Insufficient documentation

## 2012-08-06 DIAGNOSIS — R059 Cough, unspecified: Secondary | ICD-10-CM | POA: Insufficient documentation

## 2012-08-06 DIAGNOSIS — R0602 Shortness of breath: Secondary | ICD-10-CM | POA: Insufficient documentation

## 2012-08-06 DIAGNOSIS — R509 Fever, unspecified: Secondary | ICD-10-CM | POA: Insufficient documentation

## 2012-08-06 DIAGNOSIS — R05 Cough: Secondary | ICD-10-CM | POA: Insufficient documentation

## 2012-08-06 DIAGNOSIS — J3489 Other specified disorders of nose and nasal sinuses: Secondary | ICD-10-CM | POA: Insufficient documentation

## 2012-08-06 DIAGNOSIS — R0982 Postnasal drip: Secondary | ICD-10-CM | POA: Insufficient documentation

## 2012-08-06 DIAGNOSIS — R55 Syncope and collapse: Secondary | ICD-10-CM

## 2012-08-06 DIAGNOSIS — R42 Dizziness and giddiness: Secondary | ICD-10-CM | POA: Insufficient documentation

## 2012-08-06 DIAGNOSIS — Z8619 Personal history of other infectious and parasitic diseases: Secondary | ICD-10-CM | POA: Insufficient documentation

## 2012-08-06 DIAGNOSIS — Z3202 Encounter for pregnancy test, result negative: Secondary | ICD-10-CM | POA: Insufficient documentation

## 2012-08-06 DIAGNOSIS — J029 Acute pharyngitis, unspecified: Secondary | ICD-10-CM | POA: Insufficient documentation

## 2012-08-06 LAB — CBC WITH DIFFERENTIAL/PLATELET
Basophils Absolute: 0 10*3/uL (ref 0.0–0.1)
Eosinophils Relative: 0 % (ref 0–5)
Lymphocytes Relative: 12 % (ref 12–46)
Neutro Abs: 3.8 10*3/uL (ref 1.7–7.7)
Neutrophils Relative %: 72 % (ref 43–77)
Platelets: 127 10*3/uL — ABNORMAL LOW (ref 150–400)
RBC: 4.49 MIL/uL (ref 3.87–5.11)
RDW: 12.8 % (ref 11.5–15.5)
WBC: 5.2 10*3/uL (ref 4.0–10.5)

## 2012-08-06 LAB — BASIC METABOLIC PANEL
CO2: 29 mEq/L (ref 19–32)
Calcium: 9.4 mg/dL (ref 8.4–10.5)
Chloride: 104 mEq/L (ref 96–112)
Potassium: 4.4 mEq/L (ref 3.5–5.1)
Sodium: 141 mEq/L (ref 135–145)

## 2012-08-06 LAB — URINALYSIS, ROUTINE W REFLEX MICROSCOPIC
Leukocytes, UA: NEGATIVE
Nitrite: NEGATIVE
Specific Gravity, Urine: 1.019 (ref 1.005–1.030)
Urobilinogen, UA: 0.2 mg/dL (ref 0.0–1.0)
pH: 5.5 (ref 5.0–8.0)

## 2012-08-06 LAB — PREGNANCY, URINE: Preg Test, Ur: NEGATIVE

## 2012-08-06 MED ORDER — ONDANSETRON HCL 4 MG/2ML IJ SOLN: 4.0000 mg | Freq: Once | INTRAMUSCULAR | Status: DC

## 2012-08-06 MED ORDER — HYDROMORPHONE HCL PF 1 MG/ML IJ SOLN: 1.0000 mg | Freq: Once | INTRAMUSCULAR | Status: DC

## 2012-08-06 MED ORDER — SODIUM CHLORIDE 0.9 % IV BOLUS (SEPSIS)
1000.0000 mL | Freq: Once | INTRAVENOUS | Status: AC
  Administered 2012-08-06: 1000 mL via INTRAVENOUS

## 2012-08-06 MED ORDER — ONDANSETRON 8 MG PO TBDP
8.0000 mg | ORAL_TABLET | Freq: Once | ORAL | Status: AC
  Administered 2012-08-06: 8 mg via ORAL
  Filled 2012-08-06: qty 1

## 2012-08-06 MED ORDER — AZITHROMYCIN 250 MG PO TABS: ORAL_TABLET | ORAL | Status: DC

## 2012-08-06 NOTE — ED Provider Notes (Signed)
History     CSN: [id number]  Arrival date & time 08/06/12  1257   First MD Initiated Contact with Patient 08/06/12 1444      Chief Complaint  Patient presents with  . Loss of Consciousness    (Consider location/radiation/quality/duration/timing/severity/associated sxs/prior treatment) The history is provided by the patient.    Brittney [name] is a 37 y.o. female who presents to the ED with syncopal episode. She states that she has had cough cold and congestion for 5 days and has gotten progressively worse. Today she went and took a shower and when finishing felt dizzy. She called her husband and he came and put his arms around her and she passed out. She did not fall or hit her head. He had her and helped her to the floor. The incident lasted approximately 10 seconds. After that she vomited yellow/green bile. Since then she continues to feel dizzy and have nausea. When symptoms occur she feels clammy and tingling.  She only has chest pain with cough.    Past Medical History  Diagnosis Date  . History of chicken pox     Past Surgical History  Procedure Laterality Date  . Cholecystectomy  2004  . Cesarean section      x3 (2004, 2007, 2011)    Family History  Problem Relation Age of Onset  . Diabetes Sister   . Polycystic ovary syndrome Sister   . Hypertension Sister   . Cancer Maternal Grandfather 82    prostate, stomach  . Stroke Paternal Grandmother   . Coronary artery disease Neg Hx     History  Substance Use Topics  . Smoking status: Never Smoker   . Smokeless tobacco: Never Used  . Alcohol Use: Yes     Comment: Rare    OB History   Grav Para Term Preterm Abortions TAB SAB Ect Mult Living                  Review of Systems  Constitutional: Positive for fever and chills.  HENT: Positive for congestion, sore throat and postnasal drip. Negative for ear pain and sinus pressure.   Eyes: Negative for visual disturbance.  Respiratory: Positive for cough,  chest tightness and shortness of breath.   Cardiovascular: Negative for chest pain and palpitations.  Gastrointestinal: Positive for nausea and vomiting. Negative for abdominal pain and diarrhea.  Genitourinary: Negative for dysuria, frequency and vaginal bleeding.  Skin: Negative for rash.  Neurological: Positive for syncope. Negative for light-headedness and headaches.  Psychiatric/Behavioral: Negative for confusion. The patient is not nervous/anxious.     Allergies  Review of patient's allergies indicates no known allergies.  Home Medications   Current Outpatient Rx  Name  Route  Sig  Dispense  Refill  . sertraline (ZOLOFT) 50 MG tablet   Oral   Take 50 mg by mouth daily.         Marland Kitchen azithromycin (ZITHROMAX Z-PAK) 250 MG tablet      Take 2 tablets today and then one tablet daily   6 tablet   0   . guaiFENesin-codeine (ROBITUSSIN AC) 100-10 MG/5ML syrup   Oral   Take 5 mLs by mouth at bedtime as needed for cough.   180 mL   0   . PARoxetine (PAXIL) 10 MG tablet   Oral   Take 10 mg by mouth daily.           BP 111/68  Pulse 87  Temp(Src) 98.7 F (37.1 C) (Oral)  Resp 23  Ht 5' 3.5" (1.613 m)  Wt 269 lb (122.018 kg)  BMI 46.9 kg/m2  SpO2 99%  Physical Exam  Nursing note and vitals reviewed. Constitutional: She is oriented to person, place, and time. She appears distressed.  Morbidly obese W/F  HENT:  Head: Normocephalic and atraumatic.  Right Ear: Tympanic membrane normal.  Left Ear: Tympanic membrane normal.  Nose: Rhinorrhea present. Right sinus exhibits no maxillary sinus tenderness and no frontal sinus tenderness. Left sinus exhibits no maxillary sinus tenderness and no frontal sinus tenderness.  Mouth/Throat: Uvula is midline and mucous membranes are normal. Posterior oropharyngeal erythema present.  Eyes: Conjunctivae and EOM are normal. Pupils are equal, round, and reactive to light.  Neck: Normal range of motion. Neck supple.  Cardiovascular:  Tachycardia present.   Pulmonary/Chest: Effort normal and breath sounds normal. No respiratory distress.  Abdominal: Soft. Bowel sounds are normal. There is no tenderness.  Musculoskeletal: Normal range of motion.  Neurological: She is alert and oriented to person, place, and time. She has normal strength. No cranial nerve deficit or sensory deficit. Coordination and gait normal.  Skin: Skin is warm and dry.  Psychiatric: She has a normal mood and affect. Her behavior is normal. Judgment and thought content normal.   ED Course  Procedures (including critical care time)  Assessment: 37 y.o. female with syncopal episode   Bronchitis  Plan:  Symptomatic treatment   Continue cough medication  MDM  Labs normal. EKG: {ekg findings:315101::"normal EKG, normal sinus rhythm", I have discussed this case with Dr. Charlann Boxer patient is stable for discharge home and follow up with her PCP. She will return if symptoms return.    Medication List    TAKE these medications       azithromycin 250 MG tablet  Commonly known as:  ZITHROMAX Z-PAK  Take 2 tablets today and then one tablet daily      ASK your doctor about these medications       guaiFENesin-codeine 100-10 MG/5ML syrup  Commonly known as:  ROBITUSSIN AC  Take 5 mLs by mouth at bedtime as needed for cough.     PARoxetine 10 MG tablet  Commonly known as:  PAXIL  Take 10 mg by mouth daily.     sertraline 50 MG tablet  Commonly known as:  ZOLOFT  Take 50 mg by mouth daily.            [facility] Orlene Och, [place] 08/07/12 1550

## 2012-08-06 NOTE — ED Notes (Addendum)
Pt states that everyone at work has either bronchitis or pneumonia. Thursday developed cough and sore throat. Syncopal episode PTA while in shower. A&O at present. No neuro deficits noted.

## 2012-08-06 NOTE — ED Notes (Signed)
Pt began to feel nauseated. Given Zofran.

## 2012-08-08 ENCOUNTER — Ambulatory Visit (INDEPENDENT_AMBULATORY_CARE_PROVIDER_SITE_OTHER): Payer: PRIVATE HEALTH INSURANCE | Admitting: Family Medicine

## 2012-08-08 ENCOUNTER — Encounter: Payer: Self-pay | Admitting: Family Medicine

## 2012-08-08 VITALS — BP 118/68 | HR 88 | Temp 99.5°F | Ht 63.0 in | Wt 269.8 lb

## 2012-08-08 DIAGNOSIS — J209 Acute bronchitis, unspecified: Secondary | ICD-10-CM

## 2012-08-08 NOTE — Assessment & Plan Note (Signed)
Rev ER notes/ studies and labs in detail -overall some improved but still weak and pre syncopal at times with a bad cough  Vitals and exam reassuring Will finish zpak/ take rob AC prn and rest-out of work 2 d Update if not starting to improve in a week or if worsening

## 2012-08-08 NOTE — ED Provider Notes (Signed)
History/physical exam/procedure(s) were performed by non-physician practitioner and as supervising physician I was immediately available for consultation/collaboration. I have reviewed all notes and am in agreement with care and plan.   Megahn Killings S Jemaine Prokop, MD 08/08/12 1139 

## 2012-08-08 NOTE — Patient Instructions (Addendum)
Drink lots of fluids  If you need a cough medicine refill- please call  Rest / take care of yourself  Out of work today and tomorrow Update if not starting to improve in a week or if worsening  -especially if high fever or worse cough

## 2012-08-08 NOTE — Progress Notes (Signed)
Subjective:    Patient ID: Brittney [name], female    DOB: 06-28-1975, 37 y.o.   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 Not enough     At ER labs  Lab Results  Component Value Date   WBC 5.2 08/06/2012   HGB 14.1 08/06/2012   HCT 41.8 08/06/2012   MCV 93.1 08/06/2012   PLT 127* 08/06/2012     Chemistry      Component Value Date/Time   NA 141 08/06/2012 1428   K 4.4 08/06/2012 1428   CL 104 08/06/2012 1428   CO2 29 08/06/2012 1428   BUN 9 08/06/2012 1428   CREATININE 0.70 08/06/2012 1428      Component Value Date/Time   CALCIUM 9.4 08/06/2012 1428   ALKPHOS 54 04/26/2009 1247   AST 15 04/26/2009 1247   ALT 10 04/26/2009 1247   BILITOT 1.0 04/26/2009 1247      cxr showed bronchitis EKG showed nsr  Given zithromax -no problems with that   Patient Active Problem List   Diagnosis Date Noted  . Viral pharyngitis 06/28/2012  . Redness of right eye 12/28/2011  . Routine general medical examination at a health care facility 08/27/2010   Past Medical History  Diagnosis Date  . History of chicken pox    Past Surgical History  Procedure Laterality Date  . Cholecystectomy  2004  . Cesarean section      x3 (2004, 2007, 2011)   History  Substance Use Topics  . Smoking status: Never Smoker   .  Smokeless tobacco: Never Used  . Alcohol Use: Yes     Comment: Rare   Family History  Problem Relation Age of Onset  . Diabetes Sister   . Polycystic ovary syndrome Sister   . Hypertension Sister   . Cancer Maternal Grandfather 24    prostate, stomach  . Stroke Paternal Grandmother   . Coronary artery disease Neg Hx    No Known Allergies Current Outpatient Prescriptions on File Prior to Visit  Medication Sig Dispense Refill  . guaiFENesin-codeine (ROBITUSSIN AC) 100-10 MG/5ML syrup Take 5 mLs by mouth at bedtime as needed for cough.  180 mL  0  . sertraline (ZOLOFT) 50 MG tablet Take 50 mg by mouth daily.       No current facility-administered medications on file prior to visit.    Review of Systems Review of Systems  Constitutional: Negative for unexpected weight change.  ENT pos for cong/ rhinorrhea and post nasal drip Eyes: Negative for pain and visual disturbance.  Respiratory: Negative for shortness of breath.  pos for persistent cough Cardiovascular: Negative for cp or palpitations    Gastrointestinal: Negative for nausea, diarrhea and  constipation.  Genitourinary: Negative for urgency and frequency.  Skin: Negative for pallor or rash   Neurological: Negative for weakness, light-headedness, numbness and headaches.  Hematological: Negative for adenopathy. Does not bruise/bleed easily.  Psychiatric/Behavioral: Negative for dysphoric mood. The patient is not nervous/anxious.         Objective:   Physical Exam  Constitutional: She appears well-developed and well-nourished. No distress.  obese and well appearing   HENT:  Head: Normocephalic and atraumatic.  Right Ear: External ear normal.  Left Ear: External ear normal.  Mouth/Throat: Oropharynx is clear and moist. No oropharyngeal exudate.  Nares are injected and congested  No sinus tenderness   Eyes: Conjunctivae and EOM are normal. Pupils are equal, round, and reactive to light. Right eye exhibits no discharge.  Left eye exhibits no discharge. No scleral icterus.  Neck: Normal range of motion. Neck supple. No JVD present. Carotid bruit is not present. No thyromegaly present.  Cardiovascular: Normal rate, regular rhythm, normal heart sounds and intact distal pulses.  Exam reveals no gallop.   No murmur heard. Pulmonary/Chest: Effort normal and breath sounds normal. No respiratory distress. She has no wheezes. She has no rales. She exhibits no tenderness.  Harsh hacking cough  No wheeze  Good air exch No crackles or rales Few scattered rhonchi  Abdominal: Soft. Bowel sounds are normal. She exhibits no distension, no abdominal bruit and no mass. There is no tenderness.  Musculoskeletal: She exhibits no edema.  Lymphadenopathy:    She has no cervical adenopathy.  Neurological: She is alert. She has normal reflexes. No cranial nerve deficit. She exhibits normal muscle tone. Coordination normal.  Skin: Skin is warm and dry. No rash noted. No erythema. No pallor.  Psychiatric: She has a normal mood and affect.          Assessment & Plan:

## 2012-08-21 ENCOUNTER — Other Ambulatory Visit: Payer: Self-pay | Admitting: Family Medicine

## 2012-08-21 NOTE — Telephone Encounter (Signed)
Ok to refill? Seen by Dr. Karie Schwalbe for bronchitis on 08/08/12 and was told if she needed refill on this med to call.

## 2012-08-21 NOTE — Telephone Encounter (Signed)
plz phone in. 

## 2012-08-22 NOTE — Telephone Encounter (Signed)
Rx called in as directed.   

## 2013-03-22 ENCOUNTER — Other Ambulatory Visit: Payer: Self-pay | Admitting: Obstetrics and Gynecology

## 2014-04-26 ENCOUNTER — Other Ambulatory Visit: Payer: Self-pay | Admitting: Obstetrics and Gynecology

## 2014-04-29 LAB — CYTOLOGY - PAP

## 2017-06-15 LAB — HM PAP SMEAR

## 2017-06-21 ENCOUNTER — Other Ambulatory Visit: Payer: Self-pay

## 2017-06-21 ENCOUNTER — Encounter (HOSPITAL_COMMUNITY): Payer: Self-pay | Admitting: *Deleted

## 2017-07-18 ENCOUNTER — Encounter (HOSPITAL_COMMUNITY)
Admission: AD | Disposition: A | Discharge status: Home / Self Care | Payer: Self-pay | Source: Ambulatory Visit | Attending: Obstetrics and Gynecology

## 2017-07-18 ENCOUNTER — Ambulatory Visit (HOSPITAL_COMMUNITY): Payer: No Typology Code available for payment source | Admitting: Certified Registered Nurse Anesthetist

## 2017-07-18 ENCOUNTER — Encounter (HOSPITAL_COMMUNITY): Payer: Self-pay

## 2017-07-18 ENCOUNTER — Ambulatory Visit (HOSPITAL_COMMUNITY)
Admission: AD | Admit: 2017-07-18 | Discharge: 2017-07-18 | Disposition: A | Discharge status: Home / Self Care | Payer: No Typology Code available for payment source | Source: Ambulatory Visit | Attending: Obstetrics and Gynecology | Admitting: Obstetrics and Gynecology

## 2017-07-18 ENCOUNTER — Ambulatory Visit (HOSPITAL_COMMUNITY): Payer: No Typology Code available for payment source

## 2017-07-18 ENCOUNTER — Other Ambulatory Visit: Payer: Self-pay

## 2017-07-18 DIAGNOSIS — Z6841 Body Mass Index (BMI) 40.0 and over, adult: Secondary | ICD-10-CM | POA: Insufficient documentation

## 2017-07-18 DIAGNOSIS — Z419 Encounter for procedure for purposes other than remedying health state, unspecified: Secondary | ICD-10-CM

## 2017-07-18 DIAGNOSIS — Z975 Presence of (intrauterine) contraceptive device: Secondary | ICD-10-CM | POA: Insufficient documentation

## 2017-07-18 DIAGNOSIS — Z30430 Encounter for insertion of intrauterine contraceptive device: Secondary | ICD-10-CM | POA: Diagnosis present

## 2017-07-18 DIAGNOSIS — Z538 Procedure and treatment not carried out for other reasons: Secondary | ICD-10-CM | POA: Insufficient documentation

## 2017-07-18 HISTORY — DX: Family history of other specified conditions: Z84.89

## 2017-07-18 HISTORY — DX: Depression, unspecified: F32.A

## 2017-07-18 HISTORY — PX: INTRAUTERINE DEVICE (IUD) INSERTION: SHX5877

## 2017-07-18 HISTORY — PX: HYSTEROSCOPY WITH D & C: SHX1775

## 2017-07-18 HISTORY — DX: Major depressive disorder, single episode, unspecified: F32.9

## 2017-07-18 HISTORY — DX: Anxiety disorder, unspecified: F41.9

## 2017-07-18 LAB — CBC
HCT: 41.6 % (ref 36.0–46.0)
Hemoglobin: 14.2 g/dL (ref 12.0–15.0)
MCH: 31.7 pg (ref 26.0–34.0)
MCHC: 34.1 g/dL (ref 30.0–36.0)
MCV: 92.9 fL (ref 78.0–100.0)
PLATELETS: 178 10*3/uL (ref 150–400)
RBC: 4.48 MIL/uL (ref 3.87–5.11)
RDW: 12.3 % (ref 11.5–15.5)
WBC: 6.8 10*3/uL (ref 4.0–10.5)

## 2017-07-18 LAB — PREGNANCY, URINE: PREG TEST UR: NEGATIVE

## 2017-07-18 SURGERY — DILATATION AND CURETTAGE /HYSTEROSCOPY
Anesthesia: General

## 2017-07-18 MED ORDER — SODIUM CHLORIDE 0.9 % IV SOLN
3.0000 g | INTRAVENOUS | Status: AC
  Administered 2017-07-18: 3 g via INTRAVENOUS
  Filled 2017-07-18: qty 3

## 2017-07-18 MED ORDER — LIDOCAINE HCL (CARDIAC) 20 MG/ML IV SOLN
INTRAVENOUS | Status: DC | PRN
  Administered 2017-07-18: 100 mg via INTRAVENOUS

## 2017-07-18 MED ORDER — ONDANSETRON HCL 4 MG/2ML IJ SOLN
INTRAMUSCULAR | Status: AC
  Filled 2017-07-18: qty 2

## 2017-07-18 MED ORDER — SCOPOLAMINE 1 MG/3DAYS TD PT72: 1.0000 | MEDICATED_PATCH | Freq: Once | TRANSDERMAL | Status: DC

## 2017-07-18 MED ORDER — PROPOFOL 10 MG/ML IV BOLUS
INTRAVENOUS | Status: DC | PRN
  Administered 2017-07-18: 200 mg via INTRAVENOUS

## 2017-07-18 MED ORDER — DEXAMETHASONE SODIUM PHOSPHATE 10 MG/ML IJ SOLN
INTRAMUSCULAR | Status: DC | PRN
  Administered 2017-07-18: 10 mg via INTRAVENOUS

## 2017-07-18 MED ORDER — OXYCODONE HCL 5 MG PO TABS
ORAL_TABLET | ORAL | Status: AC
  Filled 2017-07-18: qty 1

## 2017-07-18 MED ORDER — MIDAZOLAM HCL 2 MG/2ML IJ SOLN
INTRAMUSCULAR | Status: AC
  Filled 2017-07-18: qty 2

## 2017-07-18 MED ORDER — LIDOCAINE HCL (CARDIAC) 20 MG/ML IV SOLN
INTRAVENOUS | Status: AC
  Filled 2017-07-18: qty 5

## 2017-07-18 MED ORDER — KETOROLAC TROMETHAMINE 30 MG/ML IJ SOLN: 30.0000 mg | Freq: Once | INTRAMUSCULAR | Status: DC | PRN

## 2017-07-18 MED ORDER — PROMETHAZINE HCL 25 MG/ML IJ SOLN: 6.2500 mg | INTRAMUSCULAR | Status: DC | PRN

## 2017-07-18 MED ORDER — KETOROLAC TROMETHAMINE 30 MG/ML IJ SOLN
INTRAMUSCULAR | Status: AC
  Filled 2017-07-18: qty 1

## 2017-07-18 MED ORDER — DEXAMETHASONE SODIUM PHOSPHATE 10 MG/ML IJ SOLN
INTRAMUSCULAR | Status: AC
  Filled 2017-07-18: qty 1

## 2017-07-18 MED ORDER — KETOROLAC TROMETHAMINE 30 MG/ML IJ SOLN
INTRAMUSCULAR | Status: DC | PRN
  Administered 2017-07-18: 30 mg via INTRAVENOUS

## 2017-07-18 MED ORDER — LACTATED RINGERS IV SOLN
INTRAVENOUS | Status: DC
  Administered 2017-07-18: 14:00:00 via INTRAVENOUS
  Administered 2017-07-18: 125 mL/h via INTRAVENOUS

## 2017-07-18 MED ORDER — ONDANSETRON HCL 4 MG/2ML IJ SOLN
INTRAMUSCULAR | Status: DC | PRN
  Administered 2017-07-18: 4 mg via INTRAVENOUS

## 2017-07-18 MED ORDER — MIDAZOLAM HCL 2 MG/2ML IJ SOLN
INTRAMUSCULAR | Status: DC | PRN
  Administered 2017-07-18: 2 mg via INTRAVENOUS

## 2017-07-18 MED ORDER — PROPOFOL 10 MG/ML IV BOLUS
INTRAVENOUS | Status: AC
  Filled 2017-07-18: qty 20

## 2017-07-18 MED ORDER — MEPERIDINE HCL 25 MG/ML IJ SOLN: 6.2500 mg | INTRAMUSCULAR | Status: DC | PRN

## 2017-07-18 MED ORDER — SCOPOLAMINE 1 MG/3DAYS TD PT72
MEDICATED_PATCH | TRANSDERMAL | Status: AC
  Administered 2017-07-18: 1.5 mg via TRANSDERMAL
  Filled 2017-07-18: qty 1

## 2017-07-18 MED ORDER — SODIUM CHLORIDE 0.9 % IR SOLN
Status: DC | PRN
  Administered 2017-07-18: 3000 mL

## 2017-07-18 MED ORDER — LIDOCAINE HCL 1 % IJ SOLN
INTRAMUSCULAR | Status: AC
  Filled 2017-07-18: qty 20

## 2017-07-18 MED ORDER — OXYCODONE HCL 5 MG/5ML PO SOLN: 5.0000 mg | Freq: Once | ORAL | Status: AC | PRN

## 2017-07-18 MED ORDER — LIDOCAINE HCL 1 % IJ SOLN
INTRAMUSCULAR | Status: DC | PRN
  Administered 2017-07-18: 10 mL

## 2017-07-18 MED ORDER — GLYCOPYRROLATE 0.2 MG/ML IJ SOLN
INTRAMUSCULAR | Status: AC
  Filled 2017-07-18: qty 1

## 2017-07-18 MED ORDER — FENTANYL CITRATE (PF) 250 MCG/5ML IJ SOLN
INTRAMUSCULAR | Status: AC
  Filled 2017-07-18: qty 5

## 2017-07-18 MED ORDER — FENTANYL CITRATE (PF) 100 MCG/2ML IJ SOLN
INTRAMUSCULAR | Status: DC | PRN
  Administered 2017-07-18 (×3): 50 ug via INTRAVENOUS

## 2017-07-18 MED ORDER — OXYCODONE HCL 5 MG PO TABS
5.0000 mg | ORAL_TABLET | Freq: Once | ORAL | Status: AC | PRN
Start: 2017-07-18 — End: 2017-07-18
  Administered 2017-07-18: 5 mg via ORAL

## 2017-07-18 MED ORDER — GLYCOPYRROLATE 0.2 MG/ML IJ SOLN
INTRAMUSCULAR | Status: DC | PRN
  Administered 2017-07-18: 0.2 mg via INTRAVENOUS

## 2017-07-18 MED ORDER — HYDROMORPHONE HCL 1 MG/ML IJ SOLN: 0.2500 mg | INTRAMUSCULAR | Status: DC | PRN

## 2017-07-18 SURGICAL SUPPLY — 20 items
BIPOLAR CUTTING LOOP 21FR (ELECTRODE)
CANISTER SUCT 3000ML PPV (MISCELLANEOUS) ×3 IMPLANT
CATH ROBINSON RED A/P 16FR (CATHETERS) ×3 IMPLANT
ELECT COAG BIPOL BALL 21FR (ELECTRODE) IMPLANT
ELECT REM PT RETURN 9FT ADLT (ELECTROSURGICAL)
ELECTRODE REM PT RTRN 9FT ADLT (ELECTROSURGICAL) IMPLANT
GLOVE BIO SURGEON STRL SZ 6.5 (GLOVE) ×2 IMPLANT
GLOVE BIO SURGEONS STRL SZ 6.5 (GLOVE) ×1
GLOVE BIOGEL PI IND STRL 7.0 (GLOVE) ×1 IMPLANT
GLOVE BIOGEL PI INDICATOR 7.0 (GLOVE) ×2
GOWN STRL REUS W/TWL LRG LVL3 (GOWN DISPOSABLE) ×6 IMPLANT
LOOP CUTTING BIPOLAR 21FR (ELECTRODE) IMPLANT
NDL SPNL 22GX3.5 QUINCKE BK (NEEDLE) ×1 IMPLANT
NEEDLE SPNL 22GX3.5 QUINCKE BK (NEEDLE) ×3 IMPLANT
PACK VAGINAL MINOR WOMEN LF (CUSTOM PROCEDURE TRAY) ×3 IMPLANT
PAD OB MATERNITY 4.3X12.25 (PERSONAL CARE ITEMS) ×3 IMPLANT
PAD PREP 24X48 CUFFED NSTRL (MISCELLANEOUS) ×3 IMPLANT
TOWEL OR 17X24 6PK STRL BLUE (TOWEL DISPOSABLE) ×6 IMPLANT
TUBING AQUILEX INFLOW (TUBING) ×3 IMPLANT
TUBING AQUILEX OUTFLOW (TUBING) ×3 IMPLANT

## 2017-07-18 NOTE — Brief Op Note (Signed)
07/18/2017  1:55 PM  PATIENT:  Brittney [name]  42 y.o. female  PRE-OPERATIVE DIAGNOSIS:   Possible intrauterine adhesions Desires Mirena IUD  POST-OPERATIVE DIAGNOSIS:   Cervical stenosis Same  PROCEDURE:  Procedure(s): DILATATION AND CURETTAGE /HYSTEROSCOPY (N/A) INTRAUTERINE DEVICE (IUD) INSERTION, ultrasound guidance (N/A)  SURGEON:  Surgeon(s) and Role:    * Dian Queen, MD - Primary  PHYSICIAN ASSISTANT:   ASSISTANTS: none   ANESTHESIA:   paracervical block and MAC  EBL:  Minimal   BLOOD ADMINISTERED:none  DRAINS: none   LOCAL MEDICATIONS USED:  LIDOCAINE   SPECIMEN:  endometrium  DISPOSITION OF SPECIMEN:  PATHOLOGY  COUNTS:  YES  TOURNIQUET:  * No tourniquets in log *  DICTATION: .Other Dictation: Dictation Number dictated  PLAN OF CARE: Discharge to home after PACU  PATIENT DISPOSITION:  PACU - hemodynamically stable.   Delay start of Pharmacological VTE agent (>24hrs) due to surgical blood loss or risk of bleeding: not applicable

## 2017-07-18 NOTE — Anesthesia Procedure Notes (Signed)
Procedure Name: LMA Insertion Date/Time: 07/18/2017 1:39 PM Performed by: Lewie LoronGermeroth, John, MD Pre-anesthesia Checklist: Patient identified, Patient being monitored, Emergency Drugs available, Timeout performed and Suction available Patient Re-evaluated:Patient Re-evaluated prior to induction Oxygen Delivery Method: Circle System Utilized Preoxygenation: Pre-oxygenation with 100% oxygen Induction Type: IV induction Ventilation: Mask ventilation without difficulty LMA: LMA inserted LMA Size: 5.0 Number of attempts: 1 Placement Confirmation: positive ETCO2 and breath sounds checked- equal and bilateral Tube secured with: Tape

## 2017-07-18 NOTE — Discharge Instructions (Signed)
DISCHARGE INSTRUCTIONS: HYSTEROSCOPY The following instructions have been prepared to help you care for yourself upon your return home.  May Remove Scop patch on or before Thursday 4/11. Wash hands with soap and water after any contact with the patch.  May take Ibuprofen after 8:00 pm 07/18/17.   May take stool softner while taking narcotic pain medication to prevent constipation.  Drink plenty of water.  Personal hygiene:  Use sanitary pads for vaginal drainage, not tampons.  Shower the day after your procedure.  NO tub baths, pools or Jacuzzis for 2-3 weeks.  Wipe front to back after using the bathroom.  Activity and limitations:  Do NOT drive or operate any equipment for 24 hours. The effects of anesthesia are still present and drowsiness may result.  Do NOT rest in bed all day.  Walking is encouraged.  Walk up and down stairs slowly.  You may resume your normal activity in one to two days or as indicated by your physician. Sexual activity: NO intercourse for at least 2 weeks after the procedure, or as indicated by your Doctor.  Diet: Eat a light meal as desired this evening. You may resume your usual diet tomorrow.  Return to Work: You may resume your work activities in one to two days or as indicated by Therapist, sportsyour Doctor.  What to expect after your surgery: Expect to have vaginal bleeding/discharge for 2-3 days and spotting for up to 10 days. It is not unusual to have soreness for up to 1-2 weeks. You may have a slight burning sensation when you urinate for the first day. Mild cramps may continue for a couple of days. You may have a regular period in 2-6 weeks.  Call your doctor for any of the following:  Excessive vaginal bleeding or clotting, saturating and changing one pad every hour.  Inability to urinate 6 hours after discharge from hospital.  Pain not relieved by pain medication.  Fever of 100.4 F or greater.  Unusual vaginal discharge or odor.  Post  Anesthesia Care Unit [phone]

## 2017-07-18 NOTE — Anesthesia Preprocedure Evaluation (Signed)
Anesthesia Evaluation  Patient identified by MRN, date of birth, ID band Patient awake    Reviewed: Allergy & Precautions, NPO status , Patient's Chart, lab work & pertinent test results  Airway Mallampati: II  TM Distance: >3 FB Neck ROM: Full    Dental no notable dental hx.    Pulmonary neg pulmonary ROS,    Pulmonary exam normal breath sounds clear to auscultation       Cardiovascular negative cardio ROS Normal cardiovascular exam Rhythm:Regular Rate:Normal     Neuro/Psych negative neurological ROS  negative psych ROS   GI/Hepatic negative GI ROS, Neg liver ROS,   Endo/Other  Morbid obesity  Renal/GU negative Renal ROS     Musculoskeletal negative musculoskeletal ROS (+)   Abdominal (+) + obese,   Peds  Hematology negative hematology ROS (+)   Anesthesia Other Findings   Reproductive/Obstetrics negative OB ROS                             Anesthesia Physical Anesthesia Plan  ASA: III  Anesthesia Plan: General   Post-op Pain Management:    Induction: Intravenous  PONV Risk Score and Plan: 4 or greater and Ondansetron, Dexamethasone, Scopolamine patch - Pre-op and Midazolam  Airway Management Planned: LMA  Additional Equipment:   Intra-op Plan:   Post-operative Plan: Extubation in OR  Informed Consent: I have reviewed the patients History and Physical, chart, labs and discussed the procedure including the risks, benefits and alternatives for the proposed anesthesia with the patient or authorized representative who has indicated his/her understanding and acceptance.   Dental advisory given  Plan Discussed with: CRNA  Anesthesia Plan Comments:         Anesthesia Quick Evaluation

## 2017-07-18 NOTE — H&P (Signed)
42 year old female here for IUD placement. Original Mirena was displaced and unable to place another IUD due to possible adhesions in lower uterine segment.  BP 118/61   Pulse 89   Temp 98.2 F (36.8 C) (Oral)   Resp 16   Ht 5' 3.5" (1.613 m)   Wt 116.6 kg (257 lb)   SpO2 100%   BMI 44.81 kg/m  Results for orders placed or performed during the hospital encounter of 07/18/17 (from the past 24 hour(s))  CBC     Status: None   Collection Time: 07/18/17 12:25 PM  Result Value Ref Range   WBC 6.8 4.0 - 10.5 K/uL   RBC 4.48 3.87 - 5.11 MIL/uL   Hemoglobin 14.2 12.0 - 15.0 g/dL   HCT 69.641.6 29.536.0 - 28.446.0 %   MCV 92.9 78.0 - 100.0 fL   MCH 31.7 26.0 - 34.0 pg   MCHC 34.1 30.0 - 36.0 g/dL   RDW 13.212.3 44.011.5 - 10.215.5 %   Platelets 178 150 - 400 K/uL  Pregnancy, urine     Status: None   Collection Time: 07/18/17 12:25 PM  Result Value Ref Range   Preg Test, Ur NEGATIVE NEGATIVE   Abdomen is soft and non tender  Pelvic above  IMPRESSION: Desires IUD placement  Possible intrauterine adhesions  PLAN: D and C Hysteroscopy IUD Placement under Ultrasound guidance  Risks reviewed Consent signed

## 2017-07-18 NOTE — Transfer of Care (Addendum)
Immediate Anesthesia Transfer of Care Note  Patient: Brittney [name]  Procedure(s) Performed: DILATATION AND CURETTAGE /HYSTEROSCOPY (N/A ) INTRAUTERINE DEVICE (IUD) INSERTION, ultrasound guidance (N/A )  Patient Location: PACU  Anesthesia Type:General  Level of Consciousness: awake, alert  and oriented  Airway & Oxygen Therapy: Patient Spontanous Breathing and Patient connected to nasal cannula oxygen  Post-op Assessment: Report given to RN, Post -op Vital signs reviewed and stable and Patient moving all extremities X 4  Post vital signs: Reviewed and stable  Last Vitals:  Vitals Value Taken Time  BP    Temp 36.8 C 07/18/2017  2:07 PM  Pulse 82 07/18/2017  2:11 PM  Resp 12 07/18/2017  2:11 PM  SpO2 96 % 07/18/2017  2:11 PM  Vitals shown include unvalidated device data.  Last Pain:  Vitals:   07/18/17 1239  TempSrc: Oral  PainSc: 1       Patients Stated Pain Goal: 4 (07/18/17 1239)  Complications: No apparent anesthesia complications

## 2017-07-19 NOTE — Anesthesia Postprocedure Evaluation (Signed)
Anesthesia Post Note  Patient: Brittney [name]  Procedure(s) Performed: DILATATION AND CURETTAGE /HYSTEROSCOPY (N/A ) INTRAUTERINE DEVICE (IUD) INSERTION, ultrasound guidance (N/A )     Patient location during evaluation: PACU Anesthesia Type: General Level of consciousness: sedated and patient cooperative Pain management: pain level controlled Vital Signs Assessment: post-procedure vital signs reviewed and stable Respiratory status: spontaneous breathing Cardiovascular status: stable Anesthetic complications: no    Last Vitals:  Vitals:   07/18/17 1500 07/18/17 1530  BP: 114/70 127/67  Pulse: 74 73  Resp: 17 18  Temp: 36.7 C (!) 36.4 C  SpO2: 97% 97%    Last Pain:  Vitals:   07/19/17 1419  TempSrc:   PainSc: 0-No pain                 Lewie LoronJohn Jie Stickels

## 2017-07-21 ENCOUNTER — Encounter (HOSPITAL_COMMUNITY): Payer: Self-pay | Admitting: Obstetrics and Gynecology

## 2017-07-21 NOTE — OR Nursing (Signed)
Incorrect IUD documentation placement in OR record.  Mirena IUD Serial # [id number] Lot # [id number] Expiration Date: 12/11/2019 Placed 1349 07/18/2017 Location: Uterus By Dr. Marcelle OverlieMichelle Grewal

## 2017-08-02 NOTE — Op Note (Signed)
NAMLaurence [name]:  [name], Brittney             ACCOUNT NO.:  [id number]  MEDICAL RECORD NO.:  [id number]  LOCATION:                                 FACILITY:  PHYSICIAN:  Kloee Ballew L. Vincente PoliGrewal, M.D.    DATE OF BIRTH:  DATE OF PROCEDURE:  07/18/2017 DATE OF DISCHARGE:                              OPERATIVE REPORT   PREOPERATIVE DIAGNOSIS:  Possible intrauterine adhesions and desires Mirena IUD.  POSTOPERATIVE DIAGNOSIS:  Cervical stenosis and possible intrauterine adhesions and desires Mirena IUD.  PROCEDURE:  D and C, hysteroscopy, and placement of Mirena IUD with ultrasound guidance.  SURGEON:  Sakoya Win L. Vincente PoliGrewal, M.D.  EBL:  Minimal.  COMPLICATIONS:  None.  LOCAL MEDICATIONS:  Lidocaine.  SPECIMENS:  Endometrium.  DESCRIPTION OF PROCEDURE:  The patient was taken to the operating room after she was consented about the risk of the procedure.  She was administered anesthesia.  She was prepped and draped in the usual sterile fashion.  A speculum was inserted into the vagina __________ dilated the cervix and inserted the hysteroscope into the intrauterine cavity and could see the __________.  I then removed the hysteroscope. I then inserted a sharp curette __________ that was difficult for her to have her IUD placed __________.  Using ultrasound after curettage was performed, I then inserted the Mirena IUD __________.  All sponge, lap, and instrument counts were correct x2.  The patient went to recovery room in stable condition.     [name] L. Vincente PoliGrewal, M.D.     Florestine AversMLG/MEDQ  D:  08/02/2017  T:  08/02/2017  Job:  [id number]

## 2018-01-02 ENCOUNTER — Other Ambulatory Visit: Payer: Self-pay | Admitting: Occupational Medicine

## 2018-01-02 ENCOUNTER — Ambulatory Visit: Payer: Self-pay

## 2018-01-02 DIAGNOSIS — Z0000 Encounter for general adult medical examination without abnormal findings: Secondary | ICD-10-CM

## 2019-09-13 ENCOUNTER — Other Ambulatory Visit: Payer: Self-pay

## 2019-09-13 ENCOUNTER — Encounter: Payer: Self-pay | Admitting: Physician Assistant

## 2019-09-13 ENCOUNTER — Ambulatory Visit: Payer: PRIVATE HEALTH INSURANCE | Admitting: Physician Assistant

## 2019-09-13 VITALS — BP 138/86 | HR 97 | Temp 98.9°F | Ht 63.5 in | Wt 295.9 lb

## 2019-09-13 DIAGNOSIS — F419 Anxiety disorder, unspecified: Secondary | ICD-10-CM | POA: Diagnosis not present

## 2019-09-13 DIAGNOSIS — Z1231 Encounter for screening mammogram for malignant neoplasm of breast: Secondary | ICD-10-CM

## 2019-09-13 DIAGNOSIS — F5104 Psychophysiologic insomnia: Secondary | ICD-10-CM

## 2019-09-13 DIAGNOSIS — Z7689 Persons encountering health services in other specified circumstances: Secondary | ICD-10-CM | POA: Diagnosis not present

## 2019-09-13 DIAGNOSIS — Z0000 Encounter for general adult medical examination without abnormal findings: Secondary | ICD-10-CM

## 2019-09-13 DIAGNOSIS — F329 Major depressive disorder, single episode, unspecified: Secondary | ICD-10-CM

## 2019-09-13 NOTE — Patient Instructions (Signed)

## 2019-09-13 NOTE — Progress Notes (Signed)
Established Patient Office Visit  Subjective:  Patient ID: Brittney [name], female    DOB: 07-06-1975  Age: 44 y.o. MRN: [id number]  CC: No chief complaint on file.   HPI Brittney [name] presents to establish care with no acute concerns. She hasn't had a PCP. Pt sees her Ob-Gyn- Dr. Marcelle Overlie at [facility] for annual female examinations, anxiety and depression, and weight loss. Reports she was started on Xanax to help with insomnia and on Wellbutrin and Adderall to help with mood, weight loss and fatigue. She expresses motivation to start losing weight but finds it challenging to make dietary changes and be more physically active when she feels tired and exhausted. She has also started noticing some lower extremity edema. States since childhood she has been of larger size but is ready to start making some changes for her overall health. She had biometric screenings done through work which revealed pre-diabetes, hypertriglyceridemia and dyslipidemia and is working with a Control and instrumentation engineer at work.     Past Medical History:  Diagnosis Date  . Anxiety   . Depression   . Family history of adverse reaction to anesthesia    MOM AND SISTER    . History of chicken pox     Past Surgical History:  Procedure Laterality Date  . CESAREAN SECTION     x3 (2004, 2007, 2011)  . CHOLECYSTECTOMY  2004  . HYSTEROSCOPY WITH D & C N/A 07/18/2017   Procedure: DILATATION AND CURETTAGE /HYSTEROSCOPY;  Surgeon: Marcelle Overlie, MD;  Location: [facility];  Service: Gynecology;  Laterality: N/A;  . INTRAUTERINE DEVICE (IUD) INSERTION N/A 07/18/2017   Procedure: INTRAUTERINE DEVICE (IUD) INSERTION, ultrasound guidance;  Surgeon: Marcelle Overlie, MD;  Location: [facility];  Service: Gynecology;  Laterality: N/A;    Family History  Problem Relation Age of Onset  . Diabetes Sister   . Polycystic ovary syndrome Sister   . Hypertension Sister   . Depression Sister   . Cancer Maternal Grandfather 10        prostate, stomach  . Alcohol abuse Maternal Grandfather   . Stroke Paternal Grandmother   . Depression Mother   . Coronary artery disease Neg Hx     Social History   Socioeconomic History  . Marital status: Married    Spouse name: Not on file  . Number of children: 3  . Years of education: some coll  . Highest education level: Not on file  Occupational History  . Occupation: accounts IT consultant: Chief Strategy Officer    Comment: [employer]  Tobacco Use  . Smoking status: Never Smoker  . Smokeless tobacco: Never Used  Vaping Use  . Vaping Use: Never used  Substance and Sexual Activity  . Alcohol use: Yes    Comment: Rare  . Drug use: No  . Sexual activity: Yes    Birth control/protection: I.U.D.  Other Topics Concern  . Not on file  Social History Narrative   Caffeine: 2 cups diet sodas   Lives with husband, 3 children, 1 dog         Social Determinants of Health   Financial Resource Strain:   . Difficulty of Paying Living Expenses:   Food Insecurity:   . Worried About Programme researcher, broadcasting/film/video in the Last Year:   . Barista in the Last Year:   Transportation Needs:   . Freight forwarder (Medical):   Marland Kitchen Lack of Transportation (Non-Medical):   Physical Activity:   .  Days of Exercise per Week:   . Minutes of Exercise per Session:   Stress:   . Feeling of Stress :   Social Connections:   . Frequency of Communication with Friends and Family:   . Frequency of Social Gatherings with Friends and Family:   . Attends Religious Services:   . Active Member of Clubs or Organizations:   . Attends Archivist Meetings:   Marland Kitchen Marital Status:   Intimate Partner Violence:   . Fear of Current or Ex-Partner:   . Emotionally Abused:   Marland Kitchen Physically Abused:   . Sexually Abused:     Outpatient Medications Prior to Visit  Medication Sig Dispense Refill  . ALPRAZolam (XANAX) 1 MG tablet Take 1 mg by mouth at bedtime.     Marland Kitchen amphetamine-dextroamphetamine  (ADDERALL) 10 MG tablet Take 10 mg by mouth daily.    Marland Kitchen BIOTIN PO Take 1 tablet by mouth daily.    Marland Kitchen buPROPion (WELLBUTRIN XL) 150 MG 24 hr tablet Take 1 tablet by mouth daily.    Marland Kitchen ibuprofen (ADVIL,MOTRIN) 200 MG tablet Take 400 mg by mouth 2 (two) times daily as needed for headache or moderate pain.    . Loratadine (CLARITIN PO) Take 10 mg by mouth daily.     No facility-administered medications prior to visit.    No Known Allergies  ROS Review of Systems  A fourteen system review of systems was performed and found to be positive as per HPI.    Objective:    Physical Exam General:  Pleasant Caucasian female, appropriate for stated age, in no acute distress. Neuro:  Alert and oriented,  extra-ocular muscles intact  HEENT:  Normocephalic, atraumatic, neck supple Skin:  no gross rash, warm, pink. Cardiac:  RRR, S1 S2 Respiratory:  ECTA B/L, Not using accessory muscles, speaking in full sentences- unlabored. Vascular:  Ext warm, no cyanosis apprec.; cap RF less 2 sec. Lower extremity non-pitting edema present. Psych:  No HI/SI, judgement and insight good, Euthymic mood. Full Affect.   There were no vitals taken for this visit. Wt Readings from Last 3 Encounters:  07/18/17 257 lb (116.6 kg)  08/08/12 269 lb 12 oz (122.4 kg)  08/06/12 269 lb (122 kg)     Health Maintenance Due  Topic Date Due  . Hepatitis C Screening  Never done  . COVID-19 Vaccine (1) Never done  . HIV Screening  Never done  . TETANUS/TDAP  Never done  . PAP SMEAR-Modifier  04/26/2017    There are no preventive care reminders to display for this patient.  Lab Results  Component Value Date   TSH 1.88 08/28/2010   Lab Results  Component Value Date   WBC 6.8 07/18/2017   HGB 14.2 07/18/2017   HCT 41.6 07/18/2017   MCV 92.9 07/18/2017   PLT 178 07/18/2017   Lab Results  Component Value Date   NA 141 08/06/2012   K 4.4 08/06/2012   CO2 29 08/06/2012   GLUCOSE 98 08/06/2012   BUN 9 08/06/2012    CREATININE 0.70 08/06/2012   BILITOT 1.0 04/26/2009   ALKPHOS 54 04/26/2009   AST 15 04/26/2009   ALT 10 04/26/2009   PROT 6.3 04/26/2009   ALBUMIN 3.3 (L) 04/26/2009   CALCIUM 9.4 08/06/2012   GFR 97.87 08/28/2010   Lab Results  Component Value Date   CHOL 203 (H) 08/28/2010   Lab Results  Component Value Date   HDL 37.50 (L) 08/28/2010   No results found  for: [facility] Lab Results  Component Value Date   TRIG 174.0 (H) 08/28/2010   Lab Results  Component Value Date   CHOLHDL 5 08/28/2010   No results found for: HGBA1C    Assessment & Plan:   Problem List Items Addressed This Visit      Other   Obesity, Class III, BMI 40-49.9 (morbid obesity) (HCC)    - Discussed with patient various treatments for weight loss including medications and referral to [facility] Dr. Vincente Poli recently started her on Wellbutrin to help with weight loss. - Encourage patient to make dietary changes and increase physical activity level gradually. - Continue sessions with health coach at work.       Relevant Medications   amphetamine-dextroamphetamine (ADDERALL) 10 MG tablet   Anxiety and depression    - Currently managed by Ob-Gyn and recently started on Wellbutrin to help improve mood as well with weight loss. - Will continue to monitor alongside Ob-Gyn.  - Encourage to incorporate non-pharmacologic therapy such as exercise or relaxation techniques.      Relevant Medications   buPROPion (WELLBUTRIN XL) 150 MG 24 hr tablet   Psychophysiological insomnia    - Pt takes Xanax 1 mg once daily at bedtime for sleep and discussed office policy on benzodiazepines, which involves only 2 refills per 12 months due to risk of tolerance and dependence, so recommended to have Dr. Vincente Poli continue management. Pt verbalized understanding and is agreeable.  - Alternative medication therapies are available if medication needs to be changed in the future. - Encourage to use Calm App or  meditation to help with insomnia.        Other Visit Diagnoses    Encounter to establish care    -  Primary   Encounter for screening mammogram for malignant neoplasm of breast       Relevant Orders   MM Digital Screening     Healthcare Maintenance: - Advised to scheduled CPE with FBW to update immunizations, address appropriate screenings, and obtain blood work.  - Continue follow-up with Ob-Gyn.  No orders of the defined types were placed in this encounter.   Follow-up: Return in about 3 months (around 12/14/2019) for CPE and FBW 1 wk prior.    Mayer Masker, PA-C

## 2019-09-26 ENCOUNTER — Encounter: Payer: Self-pay | Admitting: Physician Assistant

## 2019-09-27 ENCOUNTER — Encounter: Payer: Self-pay | Admitting: Physician Assistant

## 2019-09-27 DIAGNOSIS — F32A Depression, unspecified: Secondary | ICD-10-CM | POA: Insufficient documentation

## 2019-09-27 DIAGNOSIS — F5104 Psychophysiologic insomnia: Secondary | ICD-10-CM | POA: Insufficient documentation

## 2019-09-27 NOTE — Assessment & Plan Note (Addendum)
-   Currently managed by Ob-Gyn and recently started on Wellbutrin to help improve mood as well with weight loss. - Will continue to monitor alongside Ob-Gyn.  - Encourage to incorporate non-pharmacologic therapy such as exercise or relaxation techniques.

## 2019-09-27 NOTE — Assessment & Plan Note (Signed)
-   Discussed with patient various treatments for weight loss including medications and referral to [facility]. Dr. Vincente Poli recently started her on Wellbutrin to help with weight loss. - Encourage patient to make dietary changes and increase physical activity level gradually. - Continue sessions with health coach at work.

## 2019-09-27 NOTE — Assessment & Plan Note (Signed)
-   Pt takes Xanax 1 mg once daily at bedtime for sleep and discussed office policy on benzodiazepines, which involves only 2 refills per 12 months due to risk of tolerance and dependence, so recommended to have Dr. Vincente Poli continue management. Pt verbalized understanding and is agreeable.  - Alternative medication therapies are available if medication needs to be changed in the future. - Encourage to use Calm App or meditation to help with insomnia.

## 2019-10-04 ENCOUNTER — Encounter: Payer: Self-pay | Admitting: Physician Assistant

## 2019-10-08 ENCOUNTER — Encounter: Payer: Self-pay | Admitting: Physician Assistant

## 2019-10-19 ENCOUNTER — Other Ambulatory Visit: Payer: Self-pay

## 2019-10-19 ENCOUNTER — Ambulatory Visit
Admission: RE | Admit: 2019-10-19 | Discharge: 2019-10-19 | Disposition: A | Discharge status: Home / Self Care | Payer: Managed Care, Other (non HMO) | Source: Ambulatory Visit | Attending: Physician Assistant | Admitting: Physician Assistant

## 2019-10-19 DIAGNOSIS — Z1231 Encounter for screening mammogram for malignant neoplasm of breast: Secondary | ICD-10-CM

## 2019-12-04 ENCOUNTER — Telehealth (INDEPENDENT_AMBULATORY_CARE_PROVIDER_SITE_OTHER): Payer: Managed Care, Other (non HMO) | Admitting: Medical-Surgical

## 2019-12-04 ENCOUNTER — Encounter: Payer: Self-pay | Admitting: Medical-Surgical

## 2019-12-04 ENCOUNTER — Other Ambulatory Visit: Payer: Self-pay

## 2019-12-04 ENCOUNTER — Other Ambulatory Visit: Payer: Self-pay | Admitting: Medical-Surgical

## 2019-12-04 ENCOUNTER — Ambulatory Visit (INDEPENDENT_AMBULATORY_CARE_PROVIDER_SITE_OTHER): Payer: Managed Care, Other (non HMO)

## 2019-12-04 VITALS — Temp 99.9°F | Ht 63.0 in | Wt 275.0 lb

## 2019-12-04 DIAGNOSIS — R05 Cough: Secondary | ICD-10-CM

## 2019-12-04 DIAGNOSIS — U071 COVID-19: Secondary | ICD-10-CM | POA: Diagnosis not present

## 2019-12-04 DIAGNOSIS — R058 Other specified cough: Secondary | ICD-10-CM

## 2019-12-04 DIAGNOSIS — R0781 Pleurodynia: Secondary | ICD-10-CM

## 2019-12-04 MED ORDER — PREDNISONE 50 MG PO TABS: 50.0000 mg | ORAL_TABLET | Freq: Every day | ORAL | 0 refills | Status: DC

## 2019-12-04 MED ORDER — GUAIFENESIN-CODEINE 100-10 MG/5ML PO SYRP: 5.0000 mL | ORAL_SOLUTION | Freq: Three times a day (TID) | ORAL | 0 refills | Status: DC | PRN

## 2019-12-04 NOTE — Progress Notes (Signed)
  Virtual Visit via Telephone   I connected with  Brittney [name]  on 12/04/19 by telephone/telehealth and verified that I am speaking with the correct person using two identifiers.   I discussed the limitations, risks, security and privacy concerns of performing an evaluation and management service by telephone, including the higher likelihood of inaccurate diagnosis and treatment, and the availability of in person appointments.  We also discussed the likely need of an additional face to face encounter for complete and high quality delivery of care.  I also discussed with the patient that there may be a patient responsible charge related to this service. The patient expressed understanding and wishes to proceed.  Provider location is in medical facility. Patient location is at their home, different from provider location. People involved in care of the patient during this telehealth encounter were myself, my nurse/medical assistant, and my front office/scheduling team member.  CC: Covid positive, continued fevers/cough/chest pain  HPI: Pleasant 44 year old female presenting via telephone visit for cough, chest pain, and fever.  She was diagnosed with Covid 7 days ago with a positive test.  Since then she has continued to have intermittent fevers T-max 100, cough productive of yellow mucus, chest discomfort, upper back pain, loss of smell/taste, and shortness of breath during coughing spells.  She has been using Tylenol as needed but no other over-the-counter medications.  Did not have Covid vaccines.  Does not have a pulse oximeter at home to monitor oxygen status.  Review of Systems: See HPI for pertinent positives and negatives.   Objective Findings:    General: Speaking full sentences, no audible heavy breathing.  Sounds alert and appropriately interactive.    Independent interpretation of tests performed by another provider:   None.  Brief History, Exam, Impression, and  Recommendations:    1. COVID-19 virus infection Checking CBC with differential.  With her level of chest discomfort and upper back pain concerned for pneumonia.  Getting stat chest x-ray.  We will send in prednisone 50 mg x 5 days.  Reviewed recommended over-the-counter cold and flu medications.  Sending in Robitussin-AC 3 times daily as needed for severe cough.  Reviewed emergency symptoms to seek urgent care or go to the emergency department for.  Patient verbalized understanding and is agreeable to the plan. - DG Chest 2 View; Future - CBC with Differential/Platelet - predniSONE (DELTASONE) 50 MG tablet; Take 1 tablet (50 mg total) by mouth daily.  Dispense: 5 tablet; Refill: 0  2. Pleurodynia/cough productive of yellow sputum Concern for pneumonia.  Getting stat chest x-ray.  Checking CBC with differential.  For now we will treat with burst dose of prednisone and cough syrup. - DG Chest 2 View; Future - CBC with Differential/Platelet  I discussed the above assessment and treatment plan with the patient. The patient was provided an opportunity to ask questions and all were answered. The patient agreed with the plan and demonstrated an understanding of the instructions.   The patient was advised to call back or seek an in-person evaluation if the symptoms worsen or if the condition fails to improve as anticipated.  Return if symptoms worsen or fail to improve.  25 minutes of non-face-to-face time was provided during this encounter.  Thayer Ohm, DNP, APRN, FNP-BC  [facility]

## 2019-12-04 NOTE — Progress Notes (Signed)
Called patient to discuss chest x-ray results.  Findings indicating possible bibasilar pneumonia, likely viral in the setting of COVID-19.  CBC with differential pending.  We will wait for these results to evaluate white blood cell count.  If significantly elevated, we will treat empirically with antibiotics.  Verbalized understanding and is agreeable to the plan.  Recommend obtaining a pulse ox for monitoring at home.  Advised patient to seek emergency care should her oxygen levels drop to 90% on room air or if she should developed significant chest pain, worsening shortness of breath, or altered mental status.

## 2019-12-05 ENCOUNTER — Other Ambulatory Visit: Payer: Managed Care, Other (non HMO)

## 2019-12-05 LAB — CBC WITH DIFFERENTIAL/PLATELET
Absolute Monocytes: 560 cells/uL (ref 200–950)
Basophils Absolute: 7 cells/uL (ref 0–200)
Basophils Relative: 0.1 %
Eosinophils Absolute: 7 cells/uL — ABNORMAL LOW (ref 15–500)
Eosinophils Relative: 0.1 %
HCT: 40.3 % (ref 35.0–45.0)
Hemoglobin: 14 g/dL (ref 11.7–15.5)
Lymphs Abs: 1239 cells/uL (ref 850–3900)
MCH: 31.7 pg (ref 27.0–33.0)
MCHC: 34.7 g/dL (ref 32.0–36.0)
MCV: 91.4 fL (ref 80.0–100.0)
MPV: 11.4 fL (ref 7.5–12.5)
Monocytes Relative: 8 %
Neutro Abs: 5187 cells/uL (ref 1500–7800)
Neutrophils Relative %: 74.1 %
Platelets: 130 10*3/uL — ABNORMAL LOW (ref 140–400)
RBC: 4.41 10*6/uL (ref 3.80–5.10)
RDW: 12.8 % (ref 11.0–15.0)
Total Lymphocyte: 17.7 %
WBC: 7 10*3/uL (ref 3.8–10.8)

## 2019-12-13 ENCOUNTER — Encounter: Payer: Self-pay | Admitting: Physician Assistant

## 2019-12-13 ENCOUNTER — Other Ambulatory Visit: Payer: Self-pay

## 2019-12-13 ENCOUNTER — Ambulatory Visit (INDEPENDENT_AMBULATORY_CARE_PROVIDER_SITE_OTHER): Payer: Managed Care, Other (non HMO) | Admitting: Physician Assistant

## 2019-12-13 VITALS — BP 101/67 | HR 102 | Ht 63.5 in | Wt 269.2 lb

## 2019-12-13 DIAGNOSIS — Z872 Personal history of diseases of the skin and subcutaneous tissue: Secondary | ICD-10-CM

## 2019-12-13 DIAGNOSIS — E66813 Obesity, class 3: Secondary | ICD-10-CM

## 2019-12-13 DIAGNOSIS — Z23 Encounter for immunization: Secondary | ICD-10-CM | POA: Diagnosis not present

## 2019-12-13 DIAGNOSIS — Z0000 Encounter for general adult medical examination without abnormal findings: Secondary | ICD-10-CM

## 2019-12-13 NOTE — Progress Notes (Signed)
Female Physical   Impression and Recommendations:    1. Healthcare maintenance   2. Need for Tdap vaccination   3. Obesity, Class III, BMI 40-49.9 (morbid obesity) (HCC)   4. History of hidradenitis suppurativa      1) Anticipatory Guidance: Discussed skin CA prevention and sunscreen when outside along with skin surveillance; eating a balanced and modest diet; physical activity at least 25 minutes per day or minimum of 150 min/ week moderate to intense activity.  2) Immunizations / Screenings / Labs:   All immunizations are up-to-date per recommendations or will be updated today if pt allows.    - Patient understands with dental and vision screens they will schedule independently.  - Will obtain CBC, CMP, HgA1c, Lipid panel, and TSH when fasting, if not already done past 12 mo/ recently   3) Weight:  BMI meaning discussed with patient.  Discussed goal to improve diet habits to improve overall feelings of well being and objective health data. Improve nutrient density of diet through increasing intake of fruits and vegetables and decreasing saturated fats, white flour products and refined sugars. - Pt has lost 6 pounds since last visit. - Continue with dietary and lifestyle changes. On medication (Wellbutrin) managed by Ob-Gyn.  4) Healthcare Maintenance: -Continue with current medication regimen. -Stay well hydrated. -Follow up in 4-6 months for reg OV: Mood, Wt -Return for fasting blood work.     No orders of the defined types were placed in this encounter.   Orders Placed This Encounter  Procedures  . Tdap vaccine greater than or equal to 7yo IM  . CBC  . Comprehensive metabolic panel  . Hemoglobin A1c  . Lipid panel  . TSH     Return for Mood, Wt in 4-6 months.    Gross side effects, risk and benefits, and alternatives of medications discussed with patient.  Patient is aware that all medications have potential side effects and we are unable to predict every side  effect or drug-drug interaction that may occur.  Expresses verbal understanding and consents to current therapy plan and treatment regimen.  F-up preventative CPE in 1 year- reminded pt again, this is in addition to any chronic care visits.    Please see orders placed and AVS handed out to patient at the end of our visit for further patient instructions/ counseling done pertaining to today's office visit.     Subjective:     CPE HPI: Brittney [name] is a 44 y.o. female who presents to [facility] today for a yearly health maintenance exam.   Health Maintenance Summary  - Reviewed and updated, unless pt declines services.   Tobacco History Reviewed:  Y, never smoker Alcohol and/or drug use:   No concerns; no use Exercise Habits:   Not meeting AHA guidelines Dental Home: Y Eye exams: Y Dermatology home: Y, not currently (hx of hidradenitis supp.)  Female Health:  PAP Smear - last known results:  06/25/2017, negative STD concerns:   none Birth control method: IUD Lumps or breast concerns: none Breast Cancer Family History: No   Additional concerns beyond health maintenance issues: none     Immunization History  Administered Date(s) Administered  . Tdap 12/13/2019     Health Maintenance  Topic Date Due  . Hepatitis C Screening  Never done  . COVID-19 Vaccine (1) Never done  . HIV Screening  Never done  . INFLUENZA VACCINE  Never done  . PAP SMEAR-Modifier  06/15/2020  . TETANUS/TDAP  12/12/2029     Wt Readings from Last 3 Encounters:  12/13/19 269 lb 3.2 oz (122.1 kg)  12/04/19 275 lb (124.7 kg)  10/03/19 295 lb 14.4 oz (134.2 kg)   BP Readings from Last 3 Encounters:  12/13/19 101/67  10/03/19 138/86  07/18/17 127/67   Pulse Readings from Last 3 Encounters:  12/13/19 (!) 112  10/03/19 97  07/18/17 73     Past Medical History:  Diagnosis Date  . Anxiety   . Depression   . Family history of adverse reaction to  anesthesia    MOM AND SISTER    . History of chicken pox       Past Surgical History:  Procedure Laterality Date  . CESAREAN SECTION     x3 (2004, 2007, 2011)  . CHOLECYSTECTOMY  2004  . HYSTEROSCOPY WITH D & C N/A 07/18/2017   Procedure: DILATATION AND CURETTAGE /HYSTEROSCOPY;  Surgeon: Marcelle Overlie, MD;  Location: [facility];  Service: Gynecology;  Laterality: N/A;  . INTRAUTERINE DEVICE (IUD) INSERTION N/A 07/18/2017   Procedure: INTRAUTERINE DEVICE (IUD) INSERTION, ultrasound guidance;  Surgeon: Marcelle Overlie, MD;  Location: [facility];  Service: Gynecology;  Laterality: N/A;      Family History  Problem Relation Age of Onset  . Diabetes Sister   . Polycystic ovary syndrome Sister   . Hypertension Sister   . Depression Sister   . Cancer Maternal Grandfather 50       prostate, stomach  . Alcohol abuse Maternal Grandfather   . Stroke Paternal Grandmother   . Depression Mother   . Coronary artery disease Neg Hx       Social History   Substance and Sexual Activity  Drug Use No  ,   Social History   Substance and Sexual Activity  Alcohol Use Yes   Comment: Rare  ,   Social History   Tobacco Use  Smoking Status Never Smoker  Smokeless Tobacco Never Used  ,   Social History   Substance and Sexual Activity  Sexual Activity Yes  . Birth control/protection: I.U.D.    Current Outpatient Medications on File Prior to Visit  Medication Sig Dispense Refill  . ALPRAZolam (XANAX) 1 MG tablet Take 1 mg by mouth at bedtime.     Marland Kitchen amphetamine-dextroamphetamine (ADDERALL) 10 MG tablet Take 10 mg by mouth daily.    Marland Kitchen BIOTIN PO Take 1 tablet by mouth daily.    Marland Kitchen buPROPion (WELLBUTRIN XL) 150 MG 24 hr tablet Take 1 tablet by mouth daily.    Marland Kitchen ibuprofen (ADVIL,MOTRIN) 200 MG tablet Take 400 mg by mouth 2 (two) times daily as needed for headache or moderate pain.    Marland Kitchen guaiFENesin-codeine (ROBITUSSIN AC) 100-10 MG/5ML syrup Take 5 mLs by mouth 3 (three) times daily as needed  for cough. (Patient not taking: Reported on 12/13/2019) 180 mL 0  . Loratadine (CLARITIN PO) Take 10 mg by mouth daily. (Patient not taking: Reported on 12/13/2019)    . predniSONE (DELTASONE) 50 MG tablet Take 1 tablet (50 mg total) by mouth daily. (Patient not taking: Reported on 12/13/2019) 5 tablet 0   No current facility-administered medications on file prior to visit.    Allergies: Patient has no known allergies.  Review of Systems: General:   Denies fever, chills, unexplained weight loss.  Optho/Auditory:   Denies visual changes, blurred vision/LOV Respiratory:   Denies SOB, DOE more than baseline levels.   Cardiovascular:   Denies chest pain, palpitations,  new onset peripheral edema  Gastrointestinal:   Denies nausea, vomiting, diarrhea.  Genitourinary: Denies dysuria, freq/ urgency, flank pain or discharge from genitals.  Endocrine:     Denies hot or cold intolerance, polyuria, polydipsia. Musculoskeletal:   Denies unexplained myalgias, joint swelling, unexplained arthralgias, gait problems.  Skin:  Denies rash, suspicious lesions Neurological:     Denies dizziness, unexplained weakness, numbness  Psychiatric/Behavioral:   Denies mood changes, suicidal or homicidal ideations, hallucinations    Objective:    Blood pressure 101/67, pulse (!) 112, height 5' 3.5" (1.613 m), weight 269 lb 3.2 oz (122.1 kg), SpO2 94 %. Body mass index is 46.94 kg/m. General Appearance:    Alert, cooperative, no distress, appears stated age  Head:    Normocephalic, without obvious abnormality, atraumatic  Eyes:    PERRL, conjunctiva/corneas clear, EOM's intact, both eyes  Ears:    Normal TM's and external ear canals, both ears  Nose:   Nares normal, septum midline, mucosa normal, no drainage    or sinus tenderness  Throat:   Lips w/o lesion, mucosa moist, and tongue normal; teeth and   gums normal  Neck:   Supple, symmetrical, trachea midline, no adenopathy;    thyroid:  no  enlargement/tenderness/nodules; no carotid   bruit or JVD  Back:     Symmetric, no curvature, ROM normal, no CVA tenderness  Lungs:     Clear to auscultation bilaterally, respirations unlabored, no Wh/ R/ R  Chest Wall:    No tenderness or gross deformity; normal excursion   Heart:    Regular rate and rhythm, S1 and S2 normal, no murmur, rub   or gallop  Breast Exam:    Deferred to Ob-Gyn  Abdomen:     Soft, non-tender, bowel sounds active all four quadrants, No   G/R/R, no masses, no organomegaly  Genitalia:   Deferred to Ob-Gyn  Rectal:   Deferred to Ob-Gyn  Extremities:   Extremities normal, atraumatic, no cyanosis or gross edema  Pulses:  Normal  Skin:   Warm, dry, Skin color, texture, turgor normal, no obvious rashes or lesions Psych: No HI/SI, judgement and insight good, Euthymic mood. Full Affect.  Neurologic:   CNII-XII grossly intact, normal strength, sensation and reflexes throughout

## 2019-12-13 NOTE — Patient Instructions (Signed)

## 2019-12-26 ENCOUNTER — Other Ambulatory Visit: Payer: Self-pay

## 2019-12-26 ENCOUNTER — Other Ambulatory Visit: Payer: Managed Care, Other (non HMO)

## 2019-12-26 DIAGNOSIS — Z0000 Encounter for general adult medical examination without abnormal findings: Secondary | ICD-10-CM

## 2019-12-27 LAB — COMPREHENSIVE METABOLIC PANEL
ALT: 8 IU/L (ref 0–32)
AST: 17 IU/L (ref 0–40)
Albumin/Globulin Ratio: 1.6 (ref 1.2–2.2)
Albumin: 4.4 g/dL (ref 3.8–4.8)
Alkaline Phosphatase: 95 IU/L (ref 44–121)
BUN/Creatinine Ratio: 28 — ABNORMAL HIGH (ref 9–23)
BUN: 23 mg/dL (ref 6–24)
Bilirubin Total: 1.6 mg/dL — ABNORMAL HIGH (ref 0.0–1.2)
CO2: 25 mmol/L (ref 20–29)
Calcium: 9.8 mg/dL (ref 8.7–10.2)
Chloride: 102 mmol/L (ref 96–106)
Creatinine, Ser: 0.83 mg/dL (ref 0.57–1.00)
GFR calc Af Amer: 101 mL/min/{1.73_m2} (ref >59)
GFR calc non Af Amer: 88 mL/min/{1.73_m2} (ref >59)
Globulin, Total: 2.7 g/dL (ref 1.5–4.5)
Glucose: 108 mg/dL — ABNORMAL HIGH (ref 65–99)
Potassium: 4.5 mmol/L (ref 3.5–5.2)
Sodium: 139 mmol/L (ref 134–144)
Total Protein: 7.1 g/dL (ref 6.0–8.5)

## 2019-12-27 LAB — HEMOGLOBIN A1C
Est. average glucose Bld gHb Est-mCnc: 114 mg/dL
Hgb A1c MFr Bld: 5.6 % (ref 4.8–5.6)

## 2019-12-27 LAB — TSH: TSH: 1.95 u[IU]/mL (ref 0.450–4.500)

## 2019-12-27 LAB — LIPID PANEL
Chol/HDL Ratio: 6.8 ratio — ABNORMAL HIGH (ref 0.0–4.4)
Cholesterol, Total: 252 mg/dL — ABNORMAL HIGH (ref 100–199)
HDL: 37 mg/dL — ABNORMAL LOW (ref >39)
LDL Chol Calc (NIH): 181 mg/dL — ABNORMAL HIGH (ref 0–99)
Triglycerides: 182 mg/dL — ABNORMAL HIGH (ref 0–149)
VLDL Cholesterol Cal: 34 mg/dL (ref 5–40)

## 2019-12-27 LAB — CBC
Hematocrit: 42.4 % (ref 34.0–46.6)
Hemoglobin: 14 g/dL (ref 11.1–15.9)
MCH: 31.3 pg (ref 26.6–33.0)
MCHC: 33 g/dL (ref 31.5–35.7)
MCV: 95 fL (ref 79–97)
Platelets: 198 10*3/uL (ref 150–450)
RBC: 4.47 x10E6/uL (ref 3.77–5.28)
RDW: 13 % (ref 11.7–15.4)
WBC: 6.4 10*3/uL (ref 3.4–10.8)

## 2020-03-25 ENCOUNTER — Encounter: Payer: Self-pay | Admitting: Physician Assistant

## 2020-03-25 ENCOUNTER — Other Ambulatory Visit: Payer: Self-pay

## 2020-03-25 ENCOUNTER — Ambulatory Visit (INDEPENDENT_AMBULATORY_CARE_PROVIDER_SITE_OTHER): Payer: Managed Care, Other (non HMO) | Admitting: Physician Assistant

## 2020-03-25 VITALS — BP 120/79 | HR 98 | Temp 98.4°F | Ht 63.5 in | Wt 274.1 lb

## 2020-03-25 DIAGNOSIS — M25521 Pain in right elbow: Secondary | ICD-10-CM

## 2020-03-25 DIAGNOSIS — M7711 Lateral epicondylitis, right elbow: Secondary | ICD-10-CM

## 2020-03-25 MED ORDER — DICLOFENAC SODIUM 75 MG PO TBEC: 75.0000 mg | DELAYED_RELEASE_TABLET | Freq: Two times a day (BID) | ORAL | 0 refills | Status: DC

## 2020-03-25 NOTE — Progress Notes (Signed)
Established Patient Office Visit  Subjective:  Patient ID: Brittney [name], female    DOB: 1975/10/17  Age: 44 y.o. MRN: [id number]  CC:  Chief Complaint  Patient presents with  . Elbow Pain    HPI Brittney [name] presents for right elbow pain  Onset: a few months ago, gradually worsening Location: right lateral elbow (dominant arm) Severity: moderate, persistent; denies swelling, redness, altered sensation/paresthesias Aggravating factors: worse with grabbing/gripping activities; worse with raking yard; now even lifting up shampoo bottle is painful Relieving factors: tried Aleve/Tylenol with mild relief Hx of trauma/injury: no Repetitive activity working in Audiological scientist   Past Medical History:  Diagnosis Date  . Anxiety   . Depression   . Family history of adverse reaction to anesthesia    MOM AND SISTER    . History of chicken pox     Past Surgical History:  Procedure Laterality Date  . CESAREAN SECTION     x3 (2004, 2007, 2011)  . CHOLECYSTECTOMY  2004  . HYSTEROSCOPY WITH D & C N/A 07/18/2017   Procedure: DILATATION AND CURETTAGE /HYSTEROSCOPY;  Surgeon: Marcelle Overlie, MD;  Location: [facility];  Service: Gynecology;  Laterality: N/A;  . INTRAUTERINE DEVICE (IUD) INSERTION N/A 07/18/2017   Procedure: INTRAUTERINE DEVICE (IUD) INSERTION, ultrasound guidance;  Surgeon: Marcelle Overlie, MD;  Location: [facility];  Service: Gynecology;  Laterality: N/A;    Family History  Problem Relation Age of Onset  . Diabetes Sister   . Polycystic ovary syndrome Sister   . Hypertension Sister   . Depression Sister   . Cancer Maternal Grandfather 89       prostate, stomach  . Alcohol abuse Maternal Grandfather   . Stroke Paternal Grandmother   . Depression Mother   . Coronary artery disease Neg Hx     Social History   Socioeconomic History  . Marital status: Married    Spouse name: Not on file  . Number of children: 3  . Years of education: some coll  . Highest education  level: Not on file  Occupational History  . Occupation: accounts IT consultant: Chief Strategy Officer    Comment: [employer]  Tobacco Use  . Smoking status: Never Smoker  . Smokeless tobacco: Never Used  Vaping Use  . Vaping Use: Never used  Substance and Sexual Activity  . Alcohol use: Yes    Comment: Rare  . Drug use: No  . Sexual activity: Yes    Birth control/protection: I.U.D.  Other Topics Concern  . Not on file  Social History Narrative   Caffeine: 2 cups diet sodas   Lives with husband, 3 children, 1 dog         Social Determinants of Health   Financial Resource Strain: Not on file  Food Insecurity: Not on file  Transportation Needs: Not on file  Physical Activity: Not on file  Stress: Not on file  Social Connections: Not on file  Intimate Partner Violence: Not on file    Outpatient Medications Prior to Visit  Medication Sig Dispense Refill  . ALPRAZolam (XANAX) 1 MG tablet Take 1 mg by mouth at bedtime.    Marland Kitchen amphetamine-dextroamphetamine (ADDERALL) 10 MG tablet Take 10 mg by mouth daily.    Marland Kitchen BIOTIN PO Take 1 tablet by mouth daily.    Marland Kitchen buPROPion (WELLBUTRIN XL) 150 MG 24 hr tablet Take 1 tablet by mouth daily.    . Loratadine (CLARITIN PO) Take 10 mg by mouth daily.    Marland Kitchen  guaiFENesin-codeine (ROBITUSSIN AC) 100-10 MG/5ML syrup Take 5 mLs by mouth 3 (three) times daily as needed for cough. 180 mL 0  . ibuprofen (ADVIL,MOTRIN) 200 MG tablet Take 400 mg by mouth 2 (two) times daily as needed for headache or moderate pain.    . predniSONE (DELTASONE) 50 MG tablet Take 1 tablet (50 mg total) by mouth daily. 5 tablet 0   No facility-administered medications prior to visit.    No Known Allergies  ROS Review of Systems  Musculoskeletal: Positive for arthralgias (R elbow). Negative for joint swelling and myalgias.  All other systems reviewed and are negative.     Objective:    Physical Exam Musculoskeletal:     Right elbow: No swelling, deformity or effusion.  Normal range of motion. Tenderness present in lateral epicondyle. No radial head or olecranon process tenderness.     Right forearm: Normal.     Comments: Upper extremity strength 5/5 symmetric   Neurological:     Sensory: Sensation is intact.     Motor: Motor function is intact.     BP 120/79   Pulse 98   Temp 98.4 F (36.9 C) (Oral)   Ht 5' 3.5" (1.613 m)   Wt 274 lb 1.6 oz (124.3 kg)   SpO2 97% Comment: on RA  BMI 47.79 kg/m  Wt Readings from Last 3 Encounters:  03/25/20 274 lb 1.6 oz (124.3 kg)  12/13/19 269 lb 3.2 oz (122.1 kg)  12/04/19 275 lb (124.7 kg)        Assessment & Plan:   Problem List Items Addressed This Visit   None   Visit Diagnoses    Right elbow pain    -  Primary   Lateral epicondylitis of right elbow       Relevant Medications   diclofenac (VOLTAREN) 75 MG EC tablet   Other Relevant Orders   Ambulatory referral to Sports Medicine   DG Elbow Complete Right      Meds ordered this encounter  Medications  . diclofenac (VOLTAREN) 75 MG EC tablet    Sig: Take 1 tablet (75 mg total) by mouth 2 (two) times daily.    Dispense:  28 tablet    Refill:  0    Order Specific Question:   Supervising Provider    Answer:   Sunnie Nielsen [[id number]]   R elbow X-ray pending Diclofenac 75 mg bid with food x 1-2 weeks Declined referral to PT Instruction provided on home rehab exercises - handout provided in AVS - perform these daily Avoid strengthening exercises for at least 2 weeks or until pain improves Discussed relative rest/avoid provoking activity - this is difficult with her job. Offered work restrictions and patient declined Follow-up with Sports Medicine  Follow-up: Return if symptoms worsen or fail to improve.    Carlis Stable, [place]

## 2020-03-25 NOTE — Patient Instructions (Addendum)
Tennis Elbow Rehab Ask your health care provider which exercises are safe for you. Do exercises exactly as told by your health care provider and adjust them as directed. It is normal to feel mild stretching, pulling, tightness, or discomfort as you do these exercises. Stop right away if you feel sudden pain or your pain gets worse. Do not begin these exercises until told by your health care provider. Stretching and range-of-motion exercises These exercises warm up your muscles and joints and improve the movement and flexibility of your elbow. These exercises also help to relieve pain, numbness, and tingling. Wrist flexion, assisted  1. Straighten your left / right elbow in front of you with your palm facing down toward the floor. ? If told by your health care provider, bend your left / right elbow to a 90-degree angle (right angle) at your side. 2. With your other hand, gently push over the back of your left / right hand so your fingers point toward the floor (flexion). Stop when you feel a gentle stretch on the back of your forearm. 3. Hold this position for __________ seconds. Repeat __________ times. Complete this exercise __________ times a day. Wrist extension, assisted  1. Straighten your left / right elbow in front of you with your palm facing up toward the ceiling. ? If told by your health care provider, bend your left / right elbow to a 90-degree angle (right angle) at your side. 2. With your other hand, gently pull your left / right hand and fingers toward the floor (extension). Stop when you feel a gentle stretch on the palm side of your forearm. 3. Hold this position for __________ seconds. Repeat __________ times. Complete this exercise __________ times a day. Assisted forearm rotation, supination 1. Sit or stand with your left / right elbow bent to a 90-degree angle (right angle) at your side. 2. Using your uninjured hand, turn (rotate) your left / right palm up toward the ceiling  (supination) until you feel a gentle stretch along the inside of your forearm. 3. Hold this position for __________ seconds. Repeat __________ times. Complete this exercise __________ times a day. Assisted forearm rotation, pronation 1. Sit or stand with your left / right elbow bent to a 90-degree angle (right angle) at your side. 2. Using your uninjured hand, rotate your left / right palm down toward the floor (pronation) until you feel a gentle stretch along the outside of your forearm. 3. Hold this position for __________ seconds. Repeat __________ times. Complete this exercise __________ times a day. Strengthening exercises These exercises build strength and endurance in your forearm and elbow. Endurance is the ability to use your muscles for a long time, even after they get tired. Radial deviation  1. Stand with a __________ weight or a hammer in your left / right hand. Or, sit while holding a rubber exercise band or tubing, with your left / right forearm supported on a table or countertop. ? If you are standing, position your forearm so that your thumb is facing forward. If you are sitting, position your forearm so that the thumb is facing the ceiling. This is the neutral position. 2. Raise your hand upward in front of you so your thumb moves toward the ceiling (radial deviation), or pull up on the rubber tubing. Keep your forearm and elbow still while you move your wrist only. 3. Hold this position for __________ seconds. 4. Slowly return to the starting position. Repeat __________ times. Complete this exercise __________ times  a day. Wrist extension, eccentric 1. Sit with your left / right forearm palm-down and supported on a table or other surface. Let your left / right wrist extend over the edge of the surface. 2. Hold a __________ weight or a piece of exercise band or tubing in your left / right hand. ? If using a rubber exercise band or tubing, hold the other end of the tubing with  your other hand. 3. Use your uninjured hand to move your left / right hand up toward the ceiling. 4. Take your uninjured hand away and slowly return to the starting position using only your left / right hand. Lowering your arm under tension is called eccentric extension. Repeat __________ times. Complete this exercise __________ times a day. Wrist extension Do not do this exercise if it causes pain at the outside of your elbow. Only do this exercise once instructed by your health care provider. 1. Sit with your left / right forearm supported on a table or other surface and your palm turned down toward the floor. Let your left / right wrist extend over the edge of the surface. 2. Hold a __________ weight or a piece of rubber exercise band or tubing. ? If you are using a rubber exercise band or tubing, hold the band or tubing in place with your other hand to provide resistance. 3. Slowly bend your wrist so your hand moves up toward the ceiling (extension). Move only your wrist, keeping your forearm and elbow still. 4. Hold this position for __________ seconds. 5. Slowly return to the starting position. Repeat __________ times. Complete this exercise __________ times a day. Forearm rotation, supination To do this exercise, you will need a lightweight hammer or rubber mallet. 1. Sit with your left / right forearm supported on a table or other surface. Bend your elbow to a 90-degree angle (right angle). Position your forearm so that your palm is facing down toward the floor, with your hand resting over the edge of the table. 2. Hold a hammer in your left / right hand. ? To make this exercise easier, hold the hammer near the head of the hammer. ? To make this exercise harder, hold the hammer near the end of the handle. 3. Without moving your wrist or elbow, slowly rotate your forearm so your palm faces up toward the ceiling (supination). 4. Hold this position for __________ seconds. 5. Slowly return  to the starting position. Repeat __________ times. Complete this exercise __________ times a day. Shoulder blade squeeze 1. Sit in a stable chair or stand with good posture. If you are sitting down, do not let your back touch the back of the chair. 2. Your arms should be at your sides with your elbows bent to a 90-degree angle (right angle). Position your forearms so that your thumbs are facing the ceiling (neutral position). 3. Without lifting your shoulders up, squeeze your shoulder blades tightly together. 4. Hold this position for __________ seconds. 5. Slowly release and return to the starting position. Repeat __________ times. Complete this exercise __________ times a day. This information is not intended to replace advice given to you by your health care provider. Make sure you discuss any questions you have with your health care provider. Document Revised: 07/20/2018 Document Reviewed: 05/23/2018 Elsevier Patient Education  2020 [employer].   Tennis Elbow  Tennis elbow (lateral epicondylitis) is inflammation of tendons in your outer forearm, near your elbow. Tendons are tissues that connect muscle to bone. When you  have tennis elbow, inflammation affects the tendons that you use to bend your wrist and move your hand up. Inflammation occurs in the lower part of the upper arm bone (humerus), where the tendons connect to the bone (lateral epicondyle). Tennis elbow often affects people who play tennis, but anyone may get the condition from repeatedly extending the wrist or turning the forearm. What are the causes? This condition is usually caused by repeatedly extending the wrist, turning the forearm, and using the hands. It can result from sports or work that requires repetitive forearm movements. In some cases, it may be caused by a sudden injury. What increases the risk? You are more likely to develop tennis elbow if you play tennis or another racket sport. You also have a higher risk  if you frequently use your hands for work. Besides people who play tennis, others at greater risk include:  Musicians.  Carpenters, painters, and plumbers.  Cooks.  Cashiers.  People who work in [employer].  Holiday representative workers.  Butchers.  People who use computers. What are the signs or symptoms? Symptoms of this condition include:  Pain and tenderness in the forearm and the outer part of the elbow. Pain may be felt only when using the arm, or it may be there all the time.  A burning feeling that starts in the elbow and spreads down the forearm.  A weak grip in the hand. How is this diagnosed? This condition may be diagnosed based on:  Your symptoms and medical history.  A physical exam.  X-rays.  MRI. How is this treated? Resting and icing your arm is often the first treatment. Your health care provider may also recommend:  Medicines to reduce pain and inflammation. These may be in the form of a pill, topical gels, or shots of a steroid medicine (cortisone).  An elbow strap to reduce stress on the area.  Physical therapy. This may include massage or exercises.  An elbow brace to restrict the movements that cause symptoms. If these treatments do not help relieve your symptoms, your health care provider may recommend surgery to remove damaged muscle and reattach healthy muscle to bone. Follow these instructions at home: Activity  Rest your elbow and wrist and avoid activities that cause symptoms, as told by your health care provider.  Do physical therapy exercises as instructed.  If you lift an object, lift it with your palm facing up. This reduces stress on your elbow. Lifestyle  If your tennis elbow is caused by sports, check your equipment and make sure that: ? You are using it correctly. ? It is the best fit for you.  If your tennis elbow is caused by work or computer use, take frequent breaks to stretch your arm. Talk with your manager about ways to  manage your condition at work. If you have a brace:  Wear the brace or strap as told by your health care provider. Remove it only as told by your health care provider.  Loosen the brace if your fingers tingle, become numb, or turn cold and blue.  Keep the brace clean.  If the brace is not waterproof, ask if you may remove it for bathing. If you must keep the brace on while bathing: ? Do not let it get wet. ? Cover it with a watertight covering when you take a bath or a shower. General instructions   If directed, put ice on the painful area: ? Put ice in a plastic bag. ? Place a towel  between your skin and the bag. ? Leave the ice on for 20 minutes, 2-3 times a day.  Take over-the-counter and prescription medicines only as told by your health care provider.  Keep all follow-up visits as told by your health care provider. This is important. Contact a health care provider if:  You have pain that gets worse or does not get better with treatment.  You have numbness or weakness in your forearm, hand, or fingers. Summary  Tennis elbow (lateral epicondylitis) is inflammation of tendons in your outer forearm, near your elbow.  Common symptoms include pain and tenderness in your forearm and the outer part of your elbow.  This condition is usually caused by repeatedly extending your wrist, turning your forearm, and using your hands.  The first treatment is often resting and icing your arm to relieve symptoms. Further treatment may include taking medicine, getting physical therapy, wearing a brace or strap, or having surgery. This information is not intended to replace advice given to you by your health care provider. Make sure you discuss any questions you have with your health care provider. Document Revised: 12/23/2017 Document Reviewed: 01/11/2017 Elsevier Patient Education  2020 [employer].

## 2020-03-31 ENCOUNTER — Ambulatory Visit
Admission: RE | Admit: 2020-03-31 | Discharge: 2020-03-31 | Disposition: A | Discharge status: Home / Self Care | Payer: Managed Care, Other (non HMO) | Source: Ambulatory Visit | Attending: Physician Assistant | Admitting: Physician Assistant

## 2020-04-02 ENCOUNTER — Ambulatory Visit (INDEPENDENT_AMBULATORY_CARE_PROVIDER_SITE_OTHER): Payer: Managed Care, Other (non HMO) | Admitting: Orthopedic Surgery

## 2020-04-02 ENCOUNTER — Other Ambulatory Visit: Payer: Self-pay

## 2020-04-02 ENCOUNTER — Encounter: Payer: Self-pay | Admitting: Orthopedic Surgery

## 2020-04-02 VITALS — Ht 63.5 in | Wt 265.0 lb

## 2020-04-02 DIAGNOSIS — M7711 Lateral epicondylitis, right elbow: Secondary | ICD-10-CM

## 2020-04-07 ENCOUNTER — Encounter: Payer: Self-pay | Admitting: Orthopedic Surgery

## 2020-04-07 ENCOUNTER — Other Ambulatory Visit: Payer: Self-pay | Admitting: Surgical

## 2020-04-07 ENCOUNTER — Other Ambulatory Visit: Payer: Self-pay | Admitting: Radiology

## 2020-04-07 DIAGNOSIS — M7711 Lateral epicondylitis, right elbow: Secondary | ICD-10-CM

## 2020-04-07 MED ORDER — CELECOXIB 100 MG PO CAPS: 100.0000 mg | ORAL_CAPSULE | Freq: Two times a day (BID) | ORAL | 0 refills | Status: DC

## 2020-04-07 MED ORDER — NITROGLYCERIN 0.2 MG/HR TD PT24: 0.2000 mg | MEDICATED_PATCH | Freq: Every day | TRANSDERMAL | 0 refills | Status: DC

## 2020-04-07 NOTE — Telephone Encounter (Signed)
Request from pharmacy for nitroglycerin patch came in. It was prescribed by [facility] and pharmacy requested indication (reason) for prescription. Sent back to pharmacy, right tennis elbow.

## 2020-04-07 NOTE — Progress Notes (Signed)
Office Visit Note   Patient: Brittney [name]           Date of Birth: February 07, 1976           MRN: [id number] Visit Date: 04/02/2020 Requested by: Carlis Stable, PA-C [id number] [place],  [place] [address] PCP: Mayer Masker, PA-C  Subjective: Chief Complaint  Patient presents with  . Right Elbow - Pain    HPI: Brittney [name] is a 44 y.o. female who presents to the office complaining of right elbow pain.  Patient notes elbow pain for the last few months.  He denies any injury.  She does not do any exercise or sports in general.  She notes difficulty with power grip and with grip when her wrist is pronated.  She has taken diclofenac a few times without any significant difference in her pain.  She has not tried physical therapy.  No previous difficulty with elbow pain in general.  No history of elbow surgery, locking, numbness/amen.  Bothers her with twisting her wrist and strong grip.  Does have occasional numbness at night..                ROS: All systems reviewed are negative as they relate to the chief complaint within the history of present illness.  Patient denies fevers or chills.  Assessment & Plan: Visit Diagnoses: No diagnosis found.  Plan: Patient is a 44 year old female presents complaining of right elbow pain.  She has maximal tenderness over the lateral epicondyle with pain that is worse with grip strength testing and wrist extension.  No injury noted.  Pain is worse with lifting with pronated grip.  Impression is right tennis elbow.  Discussed the concept of tennis elbow as well as the various treatment options.  Discussed the course of tennis elbow and she understands that it is a difficult problem to eliminate quickly.  Prescribe Celebrex twice daily and patient will use nitroglycerin patch over the area of maximal tenderness.  Referred patient to physical therapy upstairs for therapy for tennis elbow.  Follow-up in 6 weeks for clinical  recheck.  Follow-Up Instructions: No follow-ups on file.   Orders:  No orders of the defined types were placed in this encounter.  No orders of the defined types were placed in this encounter.     Procedures: No procedures performed   Clinical Data: No additional findings.  Objective: Vital Signs: Ht 5' 3.5" (1.613 m)   Wt 265 lb (120.2 kg)   BMI 46.21 kg/m   Physical Exam:  Constitutional: Patient appears well-developed HEENT:  Head: Normocephalic Eyes:EOM are normal Neck: Normal range of motion Cardiovascular: Normal rate Pulmonary/chest: Effort normal Neurologic: Patient is alert Skin: Skin is warm Psychiatric: Patient has normal mood and affect  Ortho Exam: Ortho exam demonstrates right elbow with tenderness over the lateral epicondyles.  Pain is worse at the lateral epicondyle with resisted grip strength testing and resisted wrist extension.  No tenderness over the radial nerve.  No tenderness over the medial epicondyle, biceps tendon, tricep tendon, olecranon.  No swelling at the olecranon bursa.  Biceps tendon palpable and intact.  Active flexion extension of the right elbow intact.  Specialty Comments:  No specialty comments available.  Imaging: No results found.   PMFS History: Patient Active Problem List   Diagnosis Date Noted  . Obesity, Class III, BMI 40-49.9 (morbid obesity) (HCC) 09/27/2019  . Anxiety and depression 09/27/2019  . Psychophysiological insomnia 09/27/2019  .  Unsuccessful removal of intrauterine device 07/18/2017  . Acute bronchitis 08/08/2012  . Viral pharyngitis 06/28/2012  . Redness of right eye 12/28/2011  . Routine general medical examination at a health care facility 08/27/2010   Past Medical History:  Diagnosis Date  . Anxiety   . Depression   . Family history of adverse reaction to anesthesia    MOM AND SISTER    . History of chicken pox     Family History  Problem Relation Age of Onset  . Diabetes Sister   .  Polycystic ovary syndrome Sister   . Hypertension Sister   . Depression Sister   . Cancer Maternal Grandfather 55       prostate, stomach  . Alcohol abuse Maternal Grandfather   . Stroke Paternal Grandmother   . Depression Mother   . Coronary artery disease Neg Hx     Past Surgical History:  Procedure Laterality Date  . CESAREAN SECTION     x3 (2004, 2007, 2011)  . CHOLECYSTECTOMY  2004  . HYSTEROSCOPY WITH D & C N/A 07/18/2017   Procedure: DILATATION AND CURETTAGE /HYSTEROSCOPY;  Surgeon: Marcelle Overlie, MD;  Location: [facility];  Service: Gynecology;  Laterality: N/A;  . INTRAUTERINE DEVICE (IUD) INSERTION N/A 07/18/2017   Procedure: INTRAUTERINE DEVICE (IUD) INSERTION, ultrasound guidance;  Surgeon: Marcelle Overlie, MD;  Location: [facility];  Service: Gynecology;  Laterality: N/A;   Social History   Occupational History  . Occupation: accounts IT consultant: [employer]    Comment: [employer]  Tobacco Use  . Smoking status: Never Smoker  . Smokeless tobacco: Never Used  Vaping Use  . Vaping Use: Never used  Substance and Sexual Activity  . Alcohol use: Yes    Comment: Rare  . Drug use: No  . Sexual activity: Yes    Birth control/protection: I.U.D.

## 2020-04-15 ENCOUNTER — Encounter: Payer: Self-pay | Admitting: Physician Assistant

## 2020-04-15 ENCOUNTER — Ambulatory Visit: Payer: Managed Care, Other (non HMO) | Admitting: Physician Assistant

## 2020-04-15 ENCOUNTER — Other Ambulatory Visit: Payer: Self-pay

## 2020-05-14 ENCOUNTER — Ambulatory Visit: Payer: Managed Care, Other (non HMO) | Admitting: Orthopedic Surgery

## 2020-05-21 ENCOUNTER — Other Ambulatory Visit: Payer: Self-pay

## 2020-05-21 ENCOUNTER — Ambulatory Visit: Payer: Managed Care, Other (non HMO) | Attending: Surgical | Admitting: Physical Therapy

## 2020-05-21 ENCOUNTER — Encounter: Payer: Self-pay | Admitting: Physical Therapy

## 2020-05-21 DIAGNOSIS — M25521 Pain in right elbow: Secondary | ICD-10-CM | POA: Insufficient documentation

## 2020-05-21 DIAGNOSIS — M6281 Muscle weakness (generalized): Secondary | ICD-10-CM | POA: Diagnosis present

## 2020-05-21 NOTE — Therapy (Signed)
[facility] Outpatient Rehabilitation [facility] [address] [place], [place], [address] Phone: [phone]   Fax:  [phone]  Physical Therapy Evaluation  Patient Details  Name: Brittney [name] MRN: [id number] Date of Birth: [birth year] Referring Provider (PT): Julieanne Cotton, [place]   Encounter Date: 05/21/2020   PT End of Session - 05/21/20 1657    Visit Number 1    Number of Visits 8    Date for PT Re-Evaluation 07/16/20    Authorization Type [employer]    PT Start Time 1404    PT Stop Time 1445    PT Time Calculation (min) 41 min    Activity Tolerance Patient tolerated treatment well    Behavior During Therapy [facility] for tasks assessed/performed           Past Medical History:  Diagnosis Date  . Anxiety   . Depression   . Family history of adverse reaction to anesthesia    MOM AND SISTER    . History of chicken pox     Past Surgical History:  Procedure Laterality Date  . CESAREAN SECTION     x3 (2004, 2007, 2011)  . CHOLECYSTECTOMY  2004  . HYSTEROSCOPY WITH D & C N/A 07/18/2017   Procedure: DILATATION AND CURETTAGE /HYSTEROSCOPY;  Surgeon: Marcelle Overlie, MD;  Location: [facility];  Service: Gynecology;  Laterality: N/A;  . INTRAUTERINE DEVICE (IUD) INSERTION N/A 07/18/2017   Procedure: INTRAUTERINE DEVICE (IUD) INSERTION, ultrasound guidance;  Surgeon: Marcelle Overlie, MD;  Location: [facility];  Service: Gynecology;  Laterality: N/A;    There were no vitals filed for this visit.    Subjective Assessment - 05/21/20 1407    Subjective Patient reports in October 2021 her right elbow started hurting. Pain began when she was lifting boxes at work and has immediate pain. She was told she had tennis elbow. She reports that is hard to grip stuff or pick stuff up. If she uses it to rake or vacuum then it will hurt and she has to support it. Patient has been using strap around forearm recently, she doesn't know if it is helping. She also reports from the elbow  down it will go to sleep.    Limitations Lifting;House hold activities    How long can you sit comfortably? No limitation    How long can you stand comfortably? No limitation    How long can you walk comfortably? No limitation    Diagnostic tests X-ray    Patient Stated Goals Get the elbow to stop hurting and use the arm like she usually would    Currently in Pain? Yes    Pain Score 0-No pain   7-8/10 at worst   Pain Location Elbow    Pain Orientation Right    Pain Descriptors / Indicators Aching;Throbbing;Sharp    Pain Type Chronic pain    Pain Onset More than a month ago    Pain Frequency Intermittent    Aggravating Factors  Lifting or gripping using the right arm    Pain Relieving Factors Rest              [facility] PT Assessment - 05/21/20 0001      Assessment   Medical Diagnosis Right tennis elbow    Referring Provider (PT) Julieanne Cotton, PA-C    Onset Date/Surgical Date --   October 2021   Hand Dominance Right    Next MD Visit Not scheduled    Prior Therapy None  Precautions   Precautions None      Restrictions   Weight Bearing Restrictions No      Balance Screen   Has the patient fallen in the past 6 months No    Has the patient had a decrease in activity level because of a fear of falling?  No    Is the patient reluctant to leave their home because of a fear of falling?  No      Prior Function   Level of Independence Independent    Vocation Full time employment    Scientist, clinical (histocompatibility and immunogenetics), typing and using mouse      Cognition   Overall Cognitive Status Within Functional Limits for tasks assessed      Observation/Other Assessments   Observations Patient appears in no apparent distress    Focus on Therapeutic Outcomes (FOTO)  59% functional status      Sensation   Light Touch Appears Intact      Coordination   Gross Motor Movements are Fluid and Coordinated Yes      Posture/Postural Control   Posture Comments Rounded shoulder, forward  head posturing      ROM / Strength   AROM / PROM / Strength AROM;Strength      AROM   AROM Assessment Site Elbow;Wrist    Right/Left Elbow Right;Left    Right Elbow Flexion 128    Right Elbow Extension 0   patient reports feeling of stiffness   Left Elbow Flexion 130    Left Elbow Extension 0    Right/Left Wrist Right    Right Wrist Extension 62 Degrees    Right Wrist Flexion 58 Degrees    Left Wrist Extension 60 Degrees    Left Wrist Flexion 65 Degrees      Strength   Strength Assessment Site Elbow;Forearm;Wrist;Hand    Right/Left Elbow Right;Left    Right Elbow Flexion 5/5   assessed in supination, neutral, and pronation   Right Elbow Extension 5/5    Left Elbow Flexion 5/5    Left Elbow Extension 5/5    Right/Left Forearm Right;Left    Right Forearm Pronation 5/5    Right Forearm Supination 4+/5   concordant pain   Left Forearm Pronation 5/5    Left Forearm Supination 5/5    Right/Left Wrist Right;Left    Right Wrist Flexion 5/5    Right Wrist Extension 4+/5   concordant pain   Right Wrist Radial Deviation 5/5    Right Wrist Ulnar Deviation 5/5    Left Wrist Flexion 5/5    Left Wrist Extension 5/5    Left Wrist Radial Deviation 5/5    Left Wrist Ulnar Deviation 5/5    Right/Left hand Right;Left    Right Hand Grip (lbs) 60, 56, 60    Left Hand Grip (lbs) 80, 78, 75      Palpation   Palpation comment TTP left lateral epicondyle/extensor mass insertion      Special Tests   Other special tests Maudsley's test: positive                      Objective measurements completed on examination: See above findings.       [facility] Adult PT Treatment/Exercise - 05/21/20 0001      Exercises   Exercises Elbow;Wrist      Elbow Exercises   Forearm Supination 10 reps    Forearm Pronation 10 reps      Wrist Exercises   Wrist Extension  10 reps    Theraband Level (Wrist Extension) Level 2 (Red)    Other wrist exercises Putty power grip x 10    Other wrist  exercises Putty finger/thumb extension x 10                  PT Education - 05/21/20 1419    Education Details Exam findings, POC, HEP, use of current tennis elbow brace    Person(s) Educated Patient    Methods Explanation;Demonstration;Tactile cues;Verbal cues;Handout    Comprehension Verbalized understanding;Returned demonstration;Verbal cues required;Tactile cues required;Need further instruction            PT Short Term Goals - 05/21/20 1732      PT SHORT TERM GOAL #1   Title Patient will be I with initial HEP to progress with PT    Time 4    Period Weeks    Status New    Target Date 06/18/20      PT SHORT TERM GOAL #2   Title PT will review FOTO with patient by 3rd visit    Time 3    Period Weeks    Status New    Target Date 06/11/20      PT SHORT TERM GOAL #3   Title Patient will report </= 5/10 pain level with lifting and household tasks using right arm    Time 4    Period Weeks    Status New    Target Date 06/18/20             PT Long Term Goals - 05/21/20 1734      PT LONG TERM GOAL #1   Title Patient will be I with final HEP to maintain progress from PT    Time 8    Period Weeks    Status New    Target Date 07/16/20      PT LONG TERM GOAL #2   Title Patient will report improved functional status on FOTO to >/= 66%    Time 8    Period Weeks    Status New    Target Date 07/16/20      PT LONG TERM GOAL #3   Title Patient will demonstrate 5/5 wrist extensor strength to improve ability to perform household tasks    Time 8    Period Weeks    Status New    Target Date 07/16/20      PT LONG TERM GOAL #4   Title Patient will exhibit improve grip strength >/= 15# to improve lifting at work    Time 8    Period Weeks    Status New    Target Date 07/16/20      PT LONG TERM GOAL #5   Title Patient will report </= 2/10 pain with activity to demonstrate improved tolerance and reduce functional limitation    Time 8    Period Weeks    Status  New    Target Date 07/16/20                  Plan - 05/21/20 1723    Clinical Impression Statement Patient presents to PT with report of chronic lateral elbow pain. Patient's symptoms are consistent with lateral epicondylalgia, she exhibits TTP to common extensor insertion with pain and weakness with wrist extension, resisted supination, and gripping. Patient arrived wearing tennis elbow strap so instructed on proper use and she was proivded exercises to initiate strengthening for right elbow/wrist/hand. Patient would benefit from continued skilled  PT to progress her strength and activity tolerance to reduce pain and maximize functional ability.    Personal Factors and Comorbidities Time since onset of injury/illness/exacerbation    Examination-Activity Limitations Lift    Examination-Participation Restrictions Meal Prep;Cleaning;Occupation;Yard Work    Stability/Clinical Decision Making Stable/Uncomplicated    Clinical Decision Making Low    Rehab Potential Good    PT Frequency 1x / week    PT Duration 8 weeks    PT Treatment/Interventions ADLs/Self Care Home Management;Cryotherapy;Electrical Stimulation;Iontophoresis 4mg /ml Dexamethasone;Moist Heat;Ultrasound;Therapeutic exercise;Therapeutic activities;Functional mobility training;Patient/family education;Manual techniques;Dry needling;Passive range of motion;Taping;Joint Manipulations    PT Next Visit Plan Review HEP and progress PRN, manual to right extensor region PRN, progress wrist extension stretch and grip    PT Home Exercise Plan [email or web address]    Consulted and Agree with Plan of Care Patient           Patient will benefit from skilled therapeutic intervention in order to improve the following deficits and impairments:  Postural dysfunction,Decreased strength,Pain,Decreased activity tolerance  Visit Diagnosis: Pain in right elbow  Muscle weakness (generalized)     Problem List Patient Active Problem List   Diagnosis  Date Noted  . Obesity, Class III, BMI 40-49.9 (morbid obesity) (HCC) 09/27/2019  . Anxiety and depression 09/27/2019  . Psychophysiological insomnia 09/27/2019  . Unsuccessful removal of intrauterine device 07/18/2017  . Acute bronchitis 08/08/2012  . Viral pharyngitis 06/28/2012  . Redness of right eye 12/28/2011  . Routine general medical examination at a health care facility 08/27/2010    08/29/2010, PT, DPT, LAT, ATC 05/21/20  5:39 PM Phone: [phone] Fax: [phone]   [facility] Outpatient Rehabilitation [facility] [address] [place], [place], [place] Phone: [phone]   Fax:  [phone]  Name: ADALYND DONAHOE MRN: [name] Date of Birth: February 14, 1976

## 2020-05-21 NOTE — Patient Instructions (Signed)
Access Code: [email or web address] URL: [email or web address] Date: 05/21/2020 Prepared by: Rosana Hoes  Exercises Wrist Extension with Resistance - 1 x daily - 7 x weekly - 3 sets - 10 reps Forearm Pronation and Supination with Hammer - 1 x daily - 7 x weekly - 3 sets - 10 reps Putty Squeezes Resisted Finger Extension and Thumb Abduction - 1 x daily - 7 x weekly - 3 sets - 10 reps

## 2020-05-28 ENCOUNTER — Ambulatory Visit: Payer: Managed Care, Other (non HMO)

## 2020-06-07 ENCOUNTER — Encounter: Payer: Self-pay | Admitting: Rehabilitative and Restorative Service Providers"

## 2020-06-07 ENCOUNTER — Other Ambulatory Visit: Payer: Self-pay

## 2020-06-07 ENCOUNTER — Ambulatory Visit: Payer: Managed Care, Other (non HMO) | Admitting: Rehabilitative and Restorative Service Providers"

## 2020-06-07 DIAGNOSIS — M6281 Muscle weakness (generalized): Secondary | ICD-10-CM

## 2020-06-07 DIAGNOSIS — M25521 Pain in right elbow: Secondary | ICD-10-CM

## 2020-06-07 NOTE — Therapy (Signed)
[facility] Outpatient Rehabilitation [facility] [address] [place], [place], [address] Phone: [phone]   Fax:  [phone]  Physical Therapy Treatment  Patient Details  Name: Brittney [name] MRN: [id number] Date of Birth: 1975-07-29 Referring Provider (PT): Julieanne Cotton, [place]   Encounter Date: 06/07/2020   PT End of Session - 06/07/20 1154    Visit Number 2    Number of Visits 8    Date for PT Re-Evaluation 07/16/20    PT Start Time 0951    PT Stop Time 1039    PT Time Calculation (min) 48 min    Activity Tolerance Patient tolerated treatment well;No increased pain    Behavior During Therapy WFL for tasks assessed/performed           Past Medical History:  Diagnosis Date  . Anxiety   . Depression   . Family history of adverse reaction to anesthesia    MOM AND SISTER    . History of chicken pox     Past Surgical History:  Procedure Laterality Date  . CESAREAN SECTION     x3 (2004, 2007, 2011)  . CHOLECYSTECTOMY  2004  . HYSTEROSCOPY WITH D & C N/A 07/18/2017   Procedure: DILATATION AND CURETTAGE /HYSTEROSCOPY;  Surgeon: Marcelle Overlie, MD;  Location: [facility];  Service: Gynecology;  Laterality: N/A;  . INTRAUTERINE DEVICE (IUD) INSERTION N/A 07/18/2017   Procedure: INTRAUTERINE DEVICE (IUD) INSERTION, ultrasound guidance;  Surgeon: Marcelle Overlie, MD;  Location: [facility];  Service: Gynecology;  Laterality: N/A;    There were no vitals filed for this visit.   Subjective Assessment - 06/07/20 1146    Subjective It bothers me between these two bones (at elbow); I am doing my exercises.    Currently in Pain? Yes    Pain Score 5     Pain Location Elbow    Pain Orientation Right    Pain Descriptors / Indicators Throbbing;Aching    Pain Type Chronic pain                             [facility] Adult PT Treatment/Exercise - 06/07/20 0001      Elbow Exercises   Other elbow exercises R triceps behind the head stretch 2x30 sec;  AROM pronation/supination x 10 each      Modalities   Modalities Ultrasound      Ultrasound   Ultrasound Location Brachioradialis, distal triceps, between humeroradial junction    Ultrasound Parameters 50%, 1 MHz, 1.5 w/cm2 x 8 min    Ultrasound Goals Pain      Manual Therapy   Manual therapy comments soft tissue work, trigger point distal triceps, brachioradialis throughout esp proximally; manual R brachioradialis stretching complete with inversion/eversion of hand for extra stretch                  PT Education - 06/07/20 1151    Education Details reviewed HEP with pt demonstrating all correctly; PT visually added on brachioradialis stretch seating with arm extended in air and also propped on table with pt return demo properly; discussed icing with ice pack x 10-15 min and heating with bouts x 10-15 min. if doing an ice massage being beneficial instead using an ice cube to brachoradialis proximally and also to humeroradial junction. Advised pt she may be sore for 1-2 days after treatment from the massage and she is to do the modalities as indicated above and continue with the exercises.  Person(s) Educated Patient    Methods Explanation;Demonstration    Comprehension Verbalized understanding;Returned demonstration            PT Short Term Goals - 06/07/20 1201      PT SHORT TERM GOAL #1   Title Patient will be I with initial HEP to progress with PT    Status On-going      PT SHORT TERM GOAL #2   Title PT will review FOTO with patient by 3rd visit    Status On-going      PT SHORT TERM GOAL #3   Title Patient will report </= 5/10 pain level with lifting and household tasks using right arm    Status On-going             PT Long Term Goals - 05/21/20 1734      PT LONG TERM GOAL #1   Title Patient will be I with final HEP to maintain progress from PT    Time 8    Period Weeks    Status New    Target Date 07/16/20      PT LONG TERM GOAL #2   Title Patient  will report improved functional status on FOTO to >/= 66%    Time 8    Period Weeks    Status New    Target Date 07/16/20      PT LONG TERM GOAL #3   Title Patient will demonstrate 5/5 wrist extensor strength to improve ability to perform household tasks    Time 8    Period Weeks    Status New    Target Date 07/16/20      PT LONG TERM GOAL #4   Title Patient will exhibit improve grip strength >/= 15# to improve lifting at work    Time 8    Period Weeks    Status New    Target Date 07/16/20      PT LONG TERM GOAL #5   Title Patient will report </= 2/10 pain with activity to demonstrate improved tolerance and reduce functional limitation    Time 8    Period Weeks    Status New    Target Date 07/16/20                 Plan - 06/07/20 1156    Clinical Impression Statement Pt presents to PT with R lateral elbow pain; she presents with tightness and trigger points throughout distal triceps and brachioradialis proximally > distally. With palpation of trigger points of both muscles, pain would radiate to the humeroradial junction where she experiences her pain. After treatment, tightness improved; some aggravating motions still present. PT advised pt she may be sore after treatment and to use modalities at home and to continue to do stretches. PT demonstated and pt returned demonstrated properly brachioradialis stretch. Education given on ice with ice pack and ice cube along with heating instruction as well. Pt would benefit from further PT for R elbow pain management. Pt without increased pain after tx. No soreness reported at tx end.    Rehab Potential Good    PT Frequency 1x / week    PT Duration 8 weeks    PT Treatment/Interventions ADLs/Self Care Home Management;Cryotherapy;Electrical Stimulation;Iontophoresis 4mg /ml Dexamethasone;Moist Heat;Ultrasound;Therapeutic exercise;Therapeutic activities;Functional mobility training;Patient/family education;Manual techniques;Dry  needling;Passive range of motion;Taping;Joint Manipulations    PT Next Visit Plan continue , manual to right extensor region, progress wrist extension stretch and grip, progress brachioradialis/tricep stretching R    Consulted  and Agree with Plan of Care Patient           Patient will benefit from skilled therapeutic intervention in order to improve the following deficits and impairments:  Postural dysfunction,Decreased strength,Pain,Decreased activity tolerance  Visit Diagnosis: Pain in right elbow  Muscle weakness (generalized)     Problem List Patient Active Problem List   Diagnosis Date Noted  . Obesity, Class III, BMI 40-49.9 (morbid obesity) (HCC) 09/27/2019  . Anxiety and depression 09/27/2019  . Psychophysiological insomnia 09/27/2019  . Unsuccessful removal of intrauterine device 07/18/2017  . Acute bronchitis 08/08/2012  . Viral pharyngitis 06/28/2012  . Redness of right eye 12/28/2011  . Routine general medical examination at a health care facility 08/27/2010    Thornell Sartorius, PT 06/07/2020, 12:02 PM  [facility] [address] [place], [place], [address] Phone: [phone]   Fax:  [phone]  Name: Brittney [name] MRN: [id number] Date of Birth: 14-Sep-1975

## 2020-06-12 ENCOUNTER — Ambulatory Visit: Payer: Managed Care, Other (non HMO) | Admitting: Physician Assistant

## 2020-06-16 ENCOUNTER — Other Ambulatory Visit: Payer: Self-pay

## 2020-06-16 ENCOUNTER — Ambulatory Visit: Payer: Managed Care, Other (non HMO) | Attending: Surgical | Admitting: Physical Therapy

## 2020-06-16 ENCOUNTER — Encounter: Payer: Self-pay | Admitting: Physical Therapy

## 2020-06-16 DIAGNOSIS — M25521 Pain in right elbow: Secondary | ICD-10-CM | POA: Diagnosis not present

## 2020-06-16 DIAGNOSIS — M6281 Muscle weakness (generalized): Secondary | ICD-10-CM | POA: Diagnosis present

## 2020-06-16 NOTE — Therapy (Addendum)
[facility] [address] [place], [place], [address] Phone: [phone]   Fax:  [phone]  Physical Therapy Treatment  Patient Details  Name: Brittney [name] MRN: [id number] Date of Birth: 1976-03-27 Referring Provider (PT): Julieanne Cotton, [place]   Encounter Date: 06/16/2020   PT End of Session - 06/16/20 1724     Visit Number 3    Number of Visits 8    Date for PT Re-Evaluation 07/16/20    Authorization Type [employer]    PT Start Time 1632    PT Stop Time 1720    PT Time Calculation (min) 48 min    Activity Tolerance Patient tolerated treatment well;No increased pain    Behavior During Therapy [facility] for tasks assessed/performed             Past Medical History:  Diagnosis Date   Anxiety    Depression    Family history of adverse reaction to anesthesia    MOM AND SISTER     History of chicken pox     Past Surgical History:  Procedure Laterality Date   CESAREAN SECTION     x3 (2004, 2007, 2011)   CHOLECYSTECTOMY  2004   HYSTEROSCOPY WITH D & C N/A 07/18/2017   Procedure: DILATATION AND CURETTAGE /HYSTEROSCOPY;  Surgeon: Marcelle Overlie, MD;  Location: [facility];  Service: Gynecology;  Laterality: N/A;   INTRAUTERINE DEVICE (IUD) INSERTION N/A 07/18/2017   Procedure: INTRAUTERINE DEVICE (IUD) INSERTION, ultrasound guidance;  Surgeon: Marcelle Overlie, MD;  Location: [facility];  Service: Gynecology;  Laterality: N/A;    There were no vitals filed for this visit.   Subjective Assessment - 06/16/20 1628     Subjective What the therapist did last week really helped me. It was really hurting Saturday and Sunday, was a 6-7/10 but we were doing yardwork. The PT last session said to stop doing the wrist theraband exercises and instead focus on applying heat and ice.    Currently in Pain? No/denies    Pain Score --    Pain Location --    Pain Orientation --    Pain Type --    Pain Onset --    Pain Frequency --                                                [facility] Adult PT Treatment/Exercise - 06/16/20 0001       Self-Care   Self-Care Other Self-Care Comments    Other Self-Care Comments  FOTO results      Exercises   Exercises Other Exercises    Other Exercises  UBE resistance x1, (2 fwd/2bck)      Elbow Exercises   Forearm Supination 10 reps    Bar Weights/Barbell (Forearm Supination) 2 lbs    Forearm Supination Limitations pt states she wasn't doing this at home, reeducated to do this one at home; focus on slow eccentric control    Forearm Pronation 10 reps    Bar Weights/Barbell (Forearm Pronation) 2 lbs    Forearm Pronation Limitations focus on slow eccentric control      Wrist Exercises   Wrist Extension 10 reps    Bar Weights/Barbell (Wrist Extension) 2 lbs    Other wrist exercises reverse dart throw motion (extension with ulnar deviation diagonal to flexion with radial deviation) with 2# weight 2x15  Other wrist exercises yellow therabar 2x10 R wrist flexion and extension with ulnar deviation R, x10 with L wrist flexion and extension focusing on stabilizing R wrist      Modalities   Modalities Ultrasound      Ultrasound   Ultrasound Location R wrist extensor wad    Ultrasound Parameters 50%, 1 MHz, 1.5 w/cm2 x 8 min    Ultrasound Goals Pain      Manual Therapy   Manual therapy comments soft tissue work to TrPs at R lateral epicondyle and R extensor wad (mostly extensor carpi radialis brevis)                          PT Education - 06/16/20 1725     Education Details HEP update, adding back in wrist extension exercises    Person(s) Educated Patient    Methods Explanation;Demonstration    Comprehension Verbalized understanding;Need further instruction              PT Short Term Goals - 06/16/20 1650       PT SHORT TERM GOAL #1   Title Patient will be I with initial HEP to progress with PT    Baseline reminding needed to perform full  supination/pronation (only going in one direction)    Time 4    Period Weeks    Status On-going    Target Date 06/18/20      PT SHORT TERM GOAL #2   Title PT will review FOTO with patient by 3rd visit    Status Achieved      PT SHORT TERM GOAL #3   Title Patient will report </= 5/10 pain level with lifting and household tasks using right arm    Baseline reports 6-7/10 with yard work    Time 4    Status On-going    Target Date 06/18/20                PT Long Term Goals - 05/21/20 1734       PT LONG TERM GOAL #1   Title Patient will be I with final HEP to maintain progress from PT    Time 8    Period Weeks    Status New    Target Date 07/16/20      PT LONG TERM GOAL #2   Title Patient will report improved functional status on FOTO to >/= 66%    Time 8    Period Weeks    Status New    Target Date 07/16/20      PT LONG TERM GOAL #3   Title Patient will demonstrate 5/5 wrist extensor strength to improve ability to perform household tasks    Time 8    Period Weeks    Status New    Target Date 07/16/20      PT LONG TERM GOAL #4   Title Patient will exhibit improve grip strength >/= 15# to improve lifting at work    Time 8    Period Weeks    Status New    Target Date 07/16/20      PT LONG TERM GOAL #5   Title Patient will report </= 2/10 pain with activity to demonstrate improved tolerance and reduce functional limitation    Time 8    Period Weeks    Status New    Target Date 07/16/20  Plan - 06/16/20 1738     Clinical Impression Statement Pt tolerated tx well with no adverse effects. Session focused on increasing wrist exercises with a focus on targeting the extensor carpi radialis brevis with no increase in sx. Reverse dart throwing exercise introduction this session showed decreased coordination despite external cues and tactile guidance from PT.  Pt instructed to add back in wrist extension exercise to tolerance, if this  exercise aggravates sx, then she should use a soup can. Pt has still to meet 2 of her short term goals as she still requires cueing for her exercises and has pain with yardwork. Pt would continue to benefit from further PT for increasing grip and wrist strength, decrease extensor wad pain, and increasing ability to work without pain.    PT Treatment/Interventions ADLs/Self Care Home Management;Cryotherapy;Electrical Stimulation;Iontophoresis 4mg /ml Dexamethasone;Moist Heat;Ultrasound;Therapeutic exercise;Therapeutic activities;Functional mobility training;Patient/family education;Manual techniques;Dry needling;Passive range of motion;Taping;Joint Manipulations    PT Next Visit Plan manual to R extensor wad region, increase wrist extension exercise PRN, progress wrist stretching PRN    PT Home Exercise Plan [id number]    Consulted and Agree with Plan of Care Patient             Patient will benefit from skilled therapeutic intervention in order to improve the following deficits and impairments:  Postural dysfunction,Decreased strength,Pain,Decreased activity tolerance  Visit Diagnosis: Pain in right elbow  Muscle weakness (generalized)      Problem List Patient Active Problem List   Diagnosis Date Noted   Obesity, Class III, BMI 40-49.9 (morbid obesity) (HCC) 09/27/2019   Anxiety and depression 09/27/2019   Psychophysiological insomnia 09/27/2019   Unsuccessful removal of intrauterine device 07/18/2017   Acute bronchitis 08/08/2012   Viral pharyngitis 06/28/2012   Redness of right eye 12/28/2011   Routine general medical examination at a health care facility 08/27/2010    08/29/2010, SPT 06/16/2020, 5:46 PM  [facility] [address] [place], [place], [place] Phone: [phone]   Fax:  [phone]  Name: Brittney [name] MRN: [name] Date of Birth: 1975/10/26

## 2020-06-23 ENCOUNTER — Encounter: Payer: Managed Care, Other (non HMO) | Admitting: Physical Therapy

## 2020-07-01 ENCOUNTER — Encounter: Payer: Managed Care, Other (non HMO) | Admitting: Physical Therapy

## 2020-07-02 ENCOUNTER — Ambulatory Visit: Payer: Managed Care, Other (non HMO) | Admitting: Rehabilitative and Restorative Service Providers"

## 2020-07-02 ENCOUNTER — Encounter: Payer: Self-pay | Admitting: Rehabilitative and Restorative Service Providers"

## 2020-07-02 ENCOUNTER — Other Ambulatory Visit: Payer: Self-pay

## 2020-07-02 DIAGNOSIS — M25521 Pain in right elbow: Secondary | ICD-10-CM

## 2020-07-02 DIAGNOSIS — M6281 Muscle weakness (generalized): Secondary | ICD-10-CM

## 2020-07-02 NOTE — Therapy (Addendum)
[place], [place], [address] Phone: [phone]   Fax:  [phone]  Physical Therapy Treatment/Discharge  Patient Details  Name: Brittney [name] MRN: [id number] Date of Birth: May 27, 1975 Referring Provider (PT): Donella Stade, [place]   Encounter Date: 07/02/2020   PT End of Session - 07/02/20 1511    Visit Number 4    Number of Visits 8    Date for PT Re-Evaluation 07/16/20    Authorization Type [employer]    PT Start Time 0217    PT Stop Time 0303    PT Time Calculation (min) 46 min    Activity Tolerance Patient tolerated treatment well;No increased pain    Behavior During Therapy [facility] for tasks assessed/performed           Past Medical History:  Diagnosis Date   Anxiety    Depression    Family history of adverse reaction to anesthesia    MOM AND SISTER     History of chicken pox     Past Surgical History:  Procedure Laterality Date   CESAREAN SECTION     x3 (2004, 2007, 2011)   CHOLECYSTECTOMY  2004   HYSTEROSCOPY WITH D & C N/A 07/18/2017   Procedure: DILATATION AND CURETTAGE /HYSTEROSCOPY;  Surgeon: Dian Queen, MD;  Location: [facility];  Service: Gynecology;  Laterality: N/A;   INTRAUTERINE DEVICE (IUD) INSERTION N/A 07/18/2017   Procedure: INTRAUTERINE DEVICE (IUD) INSERTION, ultrasound guidance;  Surgeon: Dian Queen, MD;  Location: [facility];  Service: Gynecology;  Laterality: N/A;    There were no vitals filed for this visit.   Subjective Assessment - 07/02/20 1505    Subjective I was doing great after last treatment until I did my yard work again. It's fine today though.    Currently in Pain? No/denies    Pain Score 0-No pain                             [facility] Adult PT Treatment/Exercise - 07/02/20 0001      Elbow Exercises   Forearm Supination 15 reps    Bar Weights/Barbell (Forearm Supination) 2 lbs    Forearm Supination Limitations without  pain    Forearm Pronation 15 reps    Bar Weights/Barbell (Forearm Pronation) 2 lbs    Forearm Pronation Limitations with concentration on motion and control    Other elbow exercises Lat/Biceps/Triceps stretch with full R arm pronation and then supination holding onto treadmill 2x30 sec each    Other elbow exercises yellow band simulation with R arm on top of L doing raking motion x 15, yellow band R row x 15; elbow pronation/wrist flexion stretch 2x30 sec      Ultrasound   Ultrasound Location Brachioradialis, humeroradial junction, distal triceps R    Ultrasound Parameters 50%, 1 MHz 1.5 w/cm2 x 8 min    Ultrasound Goals Pain      Manual Therapy   Manual therapy comments soft tissue work, trigger point with several trigger points noted in lateral biceps muscle belly that referred to painful humeroradial junction which only slightly reduced with manual therapy                    PT Short Term Goals - 07/02/20 1516      PT SHORT TERM GOAL #1   Title Patient will be I with initial HEP to progress with PT    Status  On-going      PT SHORT TERM GOAL #3   Title Patient will report </= 5/10 pain level with lifting and household tasks using right arm    Status On-going             PT Long Term Goals - 05/21/20 1734      PT LONG TERM GOAL #1   Title Patient will be I with final HEP to maintain progress from PT    Time 8    Period Weeks    Status New    Target Date 07/16/20      PT LONG TERM GOAL #2   Title Patient will report improved functional status on FOTO to >/= 66%    Time 8    Period Weeks    Status New    Target Date 07/16/20      PT LONG TERM GOAL #3   Title Patient will demonstrate 5/5 wrist extensor strength to improve ability to perform household tasks    Time 8    Period Weeks    Status New    Target Date 07/16/20      PT LONG TERM GOAL #4   Title Patient will exhibit improve grip strength >/= 15# to improve lifting at work    Time 8    Period  Weeks    Status New    Target Date 07/16/20      PT LONG TERM GOAL #5   Title Patient will report </= 2/10 pain with activity to demonstrate improved tolerance and reduce functional limitation    Time 8    Period Weeks    Status New    Target Date 07/16/20                 Plan - 07/02/20 1512    Clinical Impression Statement Pt continues to present to pain with intermittent R elbow pain which increases with manual labor but is "OK" with desk work. Aggravating factor is constant flex/ext of elbow such a with yardwork which increases her pain. With deep tissue work to biceps, several elongated tight bands presented which referred to her area of pain of humeroradial junction. PT talked with her about benefit of dry needling along with Brittney [name], PT and needling specialist. Pt to consider dry needling for next visit. Pt would benefit from further PT for continued elbow strengthening and flexibility exercises along with manual therapy and modalities as needed.    Rehab Potential Good    PT Frequency 1x / week    PT Duration 8 weeks    PT Treatment/Interventions ADLs/Self Care Home Management;Cryotherapy;Electrical Stimulation;Iontophoresis 48m/ml Dexamethasone;Moist Heat;Ultrasound;Therapeutic exercise;Therapeutic activities;Functional mobility training;Patient/family education;Manual techniques;Dry needling;Passive range of motion;Taping;Joint Manipulations    PT Next Visit Plan continue R elbow strengthening, flexibility as tolerated and manual PT and modalities as needed. Pt to consider dry needling for next visit to taut bands in R biceps.    Consulted and Agree with Plan of Care Patient           Patient will benefit from skilled therapeutic intervention in order to improve the following deficits and impairments:  Postural dysfunction,Decreased strength,Pain,Decreased activity tolerance  Visit Diagnosis: Pain in right elbow  Muscle weakness (generalized)     Problem  List Patient Active Problem List   Diagnosis Date Noted   Obesity, Class III, BMI 40-49.9 (morbid obesity) ([facility] 09/27/2019   Anxiety and depression 09/27/2019   Psychophysiological insomnia 09/27/2019   Unsuccessful removal of intrauterine device 07/18/2017  Acute bronchitis 08/08/2012   Viral pharyngitis 06/28/2012   Redness of right eye 12/28/2011   Routine general medical examination at a health care facility 08/27/2010    Myra Rude , PT 07/02/2020, 3:19 PM  [facility] [address] [place], [place], [address] Phone: [phone]   Fax:  [phone]  Name: Brittney [name] MRN: [id number] Date of Birth: 24-Jul-1975  PHYSICAL THERAPY DISCHARGE SUMMARY  Visits from Start of Care: 4  Current functional level related to goals / functional outcomes: unknown   Remaining deficits: unknown   Education / Equipment: See chart Plan: Patient agrees to discharge.  Patient goals were not met. Patient is being discharged due to not returning since the last visit.  ?????    America Brown, PT, DPT

## 2020-07-02 NOTE — Patient Instructions (Addendum)
Advised pt she may be sore from manual and exercises with PT indicating biceps and Lats may be sore and to continue HEP and using heat/ice at home, whichever she prefers. Discussed benefit of dry needling to biceps trigger bands along with Gaylyn Rong, dry needling therapist, discussing with patient as well. Pt unsure but will consider.

## 2020-07-23 ENCOUNTER — Ambulatory Visit: Payer: Managed Care, Other (non HMO) | Admitting: Rehabilitative and Restorative Service Providers"

## 2020-09-02 LAB — HM PAP SMEAR

## 2020-09-02 LAB — RESULTS CONSOLE HPV: CHL HPV: NEGATIVE

## 2020-11-21 ENCOUNTER — Ambulatory Visit: Payer: Managed Care, Other (non HMO) | Admitting: Plastic Surgery

## 2020-11-21 ENCOUNTER — Other Ambulatory Visit: Payer: Self-pay

## 2020-11-21 ENCOUNTER — Encounter: Payer: Self-pay | Admitting: Plastic Surgery

## 2020-11-21 DIAGNOSIS — R1030 Lower abdominal pain, unspecified: Secondary | ICD-10-CM

## 2020-11-22 ENCOUNTER — Encounter: Payer: Self-pay | Admitting: Plastic Surgery

## 2020-11-22 DIAGNOSIS — R109 Unspecified abdominal pain: Secondary | ICD-10-CM | POA: Insufficient documentation

## 2020-11-22 NOTE — Progress Notes (Signed)
Patient ID: Brittney [name], female    DOB: [birth year], 45 y.o.   MRN: [id number]   Chief Complaint  Patient presents with   consult    The patient is a 45 year old female here for evaluation of her abdomen.  The patient complains of pain in her lower abdominal area.  It is mostly on the left to midline.  She has had 3 C-sections and a cholecystectomy in the past.  She complains of having the pain for the past year.  She has lost 65 pounds in the past year and has been able to keep it off.  She is 5 feet 3 inches tall and weighs 245 pounds.  She has 3 kids ranging from 52 to 84 years old.  Her last hemoglobin A1c was 5.6 in 2021.  Her past medical history is positive for depression and anxiety.  She suffered from morbid obesity in the past.  Overall she is doing really well and has been very proactive about her health.  She she was referred to [place] by Dr. Milton Ferguson.  She describes the lower abdominal pain as feeling like a rope that stretches.  It happens in the same place each time.  She is not sure what brings it about and it seems to fade over time.  In her chart I do not see any abdominal films in the past 5 years.  On exam I do not feel any hernia.  She does not seem to have any rectus diastases.  Her muscle seem to be well aligned and strong.  I do not see any rashes or breakdown in her skin.   Review of Systems  Constitutional:  Negative for activity change and appetite change.  Eyes: Negative.   Respiratory: Negative.  Negative for chest tightness.   Gastrointestinal:  Positive for abdominal pain.  Endocrine: Negative.   Genitourinary: Negative.   Musculoskeletal: Negative.   Skin: Negative.  Negative for color change and wound.  Psychiatric/Behavioral: Negative.     Past Medical History:  Diagnosis Date   Anxiety    Depression    Family history of adverse reaction to anesthesia    MOM AND SISTER     History of chicken pox     Past Surgical History:  Procedure Laterality  Date   CESAREAN SECTION     x3 (2004, 2007, 2011)   CHOLECYSTECTOMY  2004   HYSTEROSCOPY WITH D & C N/A 07/18/2017   Procedure: DILATATION AND CURETTAGE /HYSTEROSCOPY;  Surgeon: Marcelle Overlie, MD;  Location: [facility];  Service: Gynecology;  Laterality: N/A;   INTRAUTERINE DEVICE (IUD) INSERTION N/A 07/18/2017   Procedure: INTRAUTERINE DEVICE (IUD) INSERTION, ultrasound guidance;  Surgeon: Marcelle Overlie, MD;  Location: [facility];  Service: Gynecology;  Laterality: N/A;      Current Outpatient Medications:    ALPRAZolam (XANAX) 1 MG tablet, Take 1 mg by mouth at bedtime., Disp: , Rfl:    amphetamine-dextroamphetamine (ADDERALL) 10 MG tablet, Take 10 mg by mouth daily., Disp: , Rfl:    BIOTIN PO, Take 1 tablet by mouth daily., Disp: , Rfl:    buPROPion (WELLBUTRIN XL) 150 MG 24 hr tablet, Take 2 tablets by mouth daily., Disp: , Rfl:    celecoxib (CELEBREX) 100 MG capsule, Take 1 capsule (100 mg total) by mouth 2 (two) times daily., Disp: 60 capsule, Rfl: 0   diclofenac (VOLTAREN) 75 MG EC tablet, Take 75 mg by mouth 2 (two) times daily., Disp: , Rfl:    Loratadine (  CLARITIN PO), Take 10 mg by mouth daily., Disp: , Rfl:    nitroGLYCERIN (NITRODUR - DOSED IN MG/24 HR) 0.2 mg/hr patch, PLACE 1 PATCH (0.2 MG TOTAL) ONTO THE SKIN DAILY. PLACE NEAR THE SPOT OF MAXIMAL TENDERNESS OVER THE RIGHT ELBOW. ALTERNATE THE SPOT EVERY DAY SO NOT PUTTING THE PATCH IN THE SAME SPOT AS YESTERDAY. DISCONTINUE USE IF YOU NOTICE CHEST DISCOMFORT OR PALPITATIONS., Disp: 30 patch, Rfl: 0   Objective:   Vitals:   11/21/20 1349  BP: 124/87  Pulse: (!) 105  SpO2: 96%    Physical Exam Vitals and nursing note reviewed.  Constitutional:      Appearance: Normal appearance.  HENT:     Head: Normocephalic and atraumatic.  Cardiovascular:     Rate and Rhythm: Normal rate.     Pulses: Normal pulses.  Pulmonary:     Effort: Pulmonary effort is normal. No respiratory distress.     Breath sounds: No wheezing.   Abdominal:     General: Abdomen is flat. There is distension.     Palpations: There is no mass.     Tenderness: There is no rebound.     Hernia: No hernia is present.  Skin:    General: Skin is warm.     Capillary Refill: Capillary refill takes less than 2 seconds.     Coloration: Skin is not jaundiced.     Findings: No bruising, erythema, lesion or rash.  Neurological:     General: No focal deficit present.     Mental Status: She is alert and oriented to person, place, and time.  Psychiatric:        Mood and Affect: Mood normal.        Behavior: Behavior normal.        Thought Content: Thought content normal.    Assessment & Plan:  Lower abdominal pain  My recommendation is for a general surgery consult due to the abdominal pain.  I cannot exclude an intra-abdominal cause for the pain.  This may be related to her C-sections.  The excision of the pannus could be done alone or in coordination with general surgery.  The patient knows to give me a call after she has seen general surgery so that we can discuss this further.  Pictures were obtained of the patient and placed in the chart with the patient's or guardian's permission.   Alena Bills Delight Bickle, DO

## 2020-12-23 ENCOUNTER — Telehealth: Payer: Managed Care, Other (non HMO) | Admitting: Plastic Surgery

## 2021-01-25 IMAGING — MG DIGITAL SCREENING BILAT W/ TOMO W/ CAD
6 of 12 series · 6 of 36 positions shown · non-contrast
Comparison: None.

CLINICAL DATA: Screening. This is the patient's initial baseline
mammogram.

EXAM:
DIGITAL SCREENING BILATERAL MAMMOGRAM WITH TOMO AND CAD

[R MLO synth-2D]
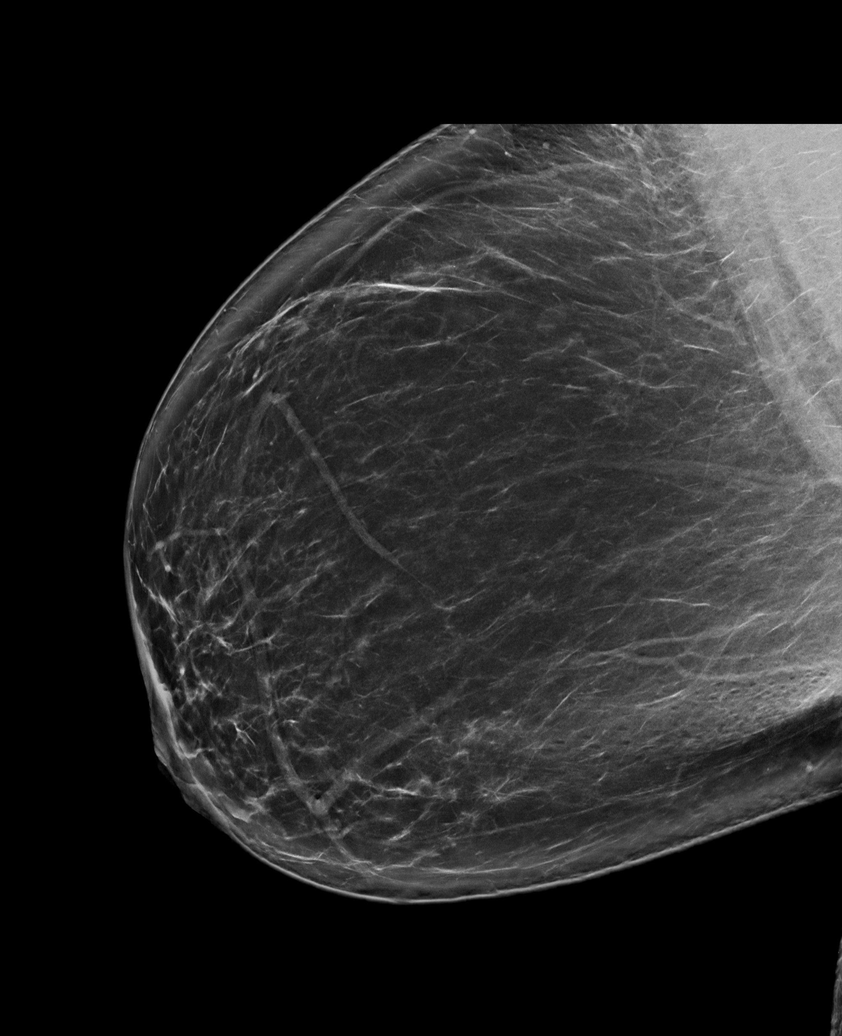

[L CC synth-2D]
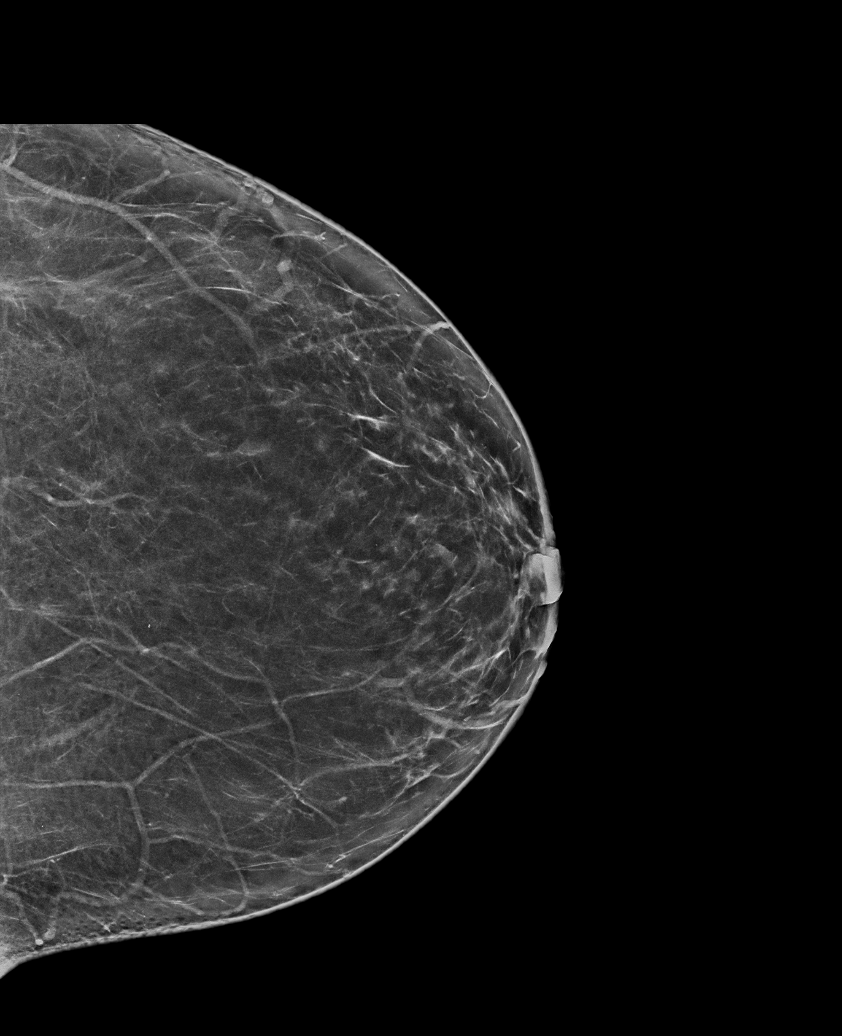

[R XCCL synth-2D]
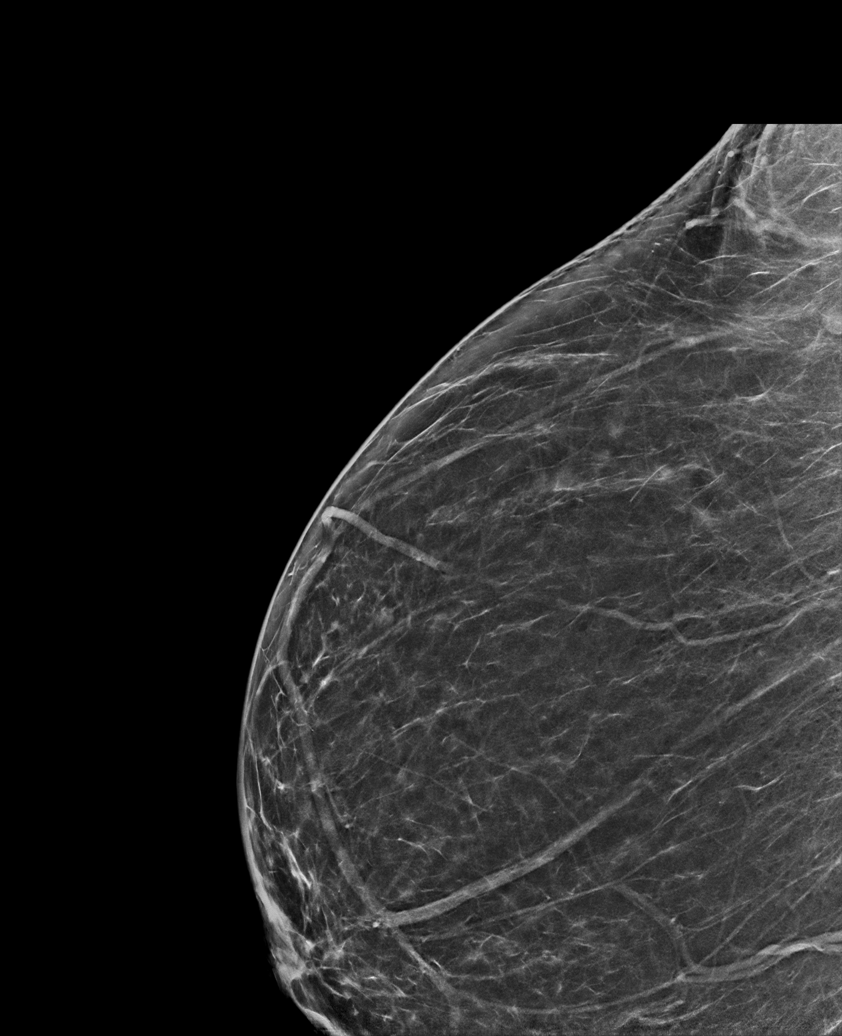

[L MLO synth-2D (1 of 2)]
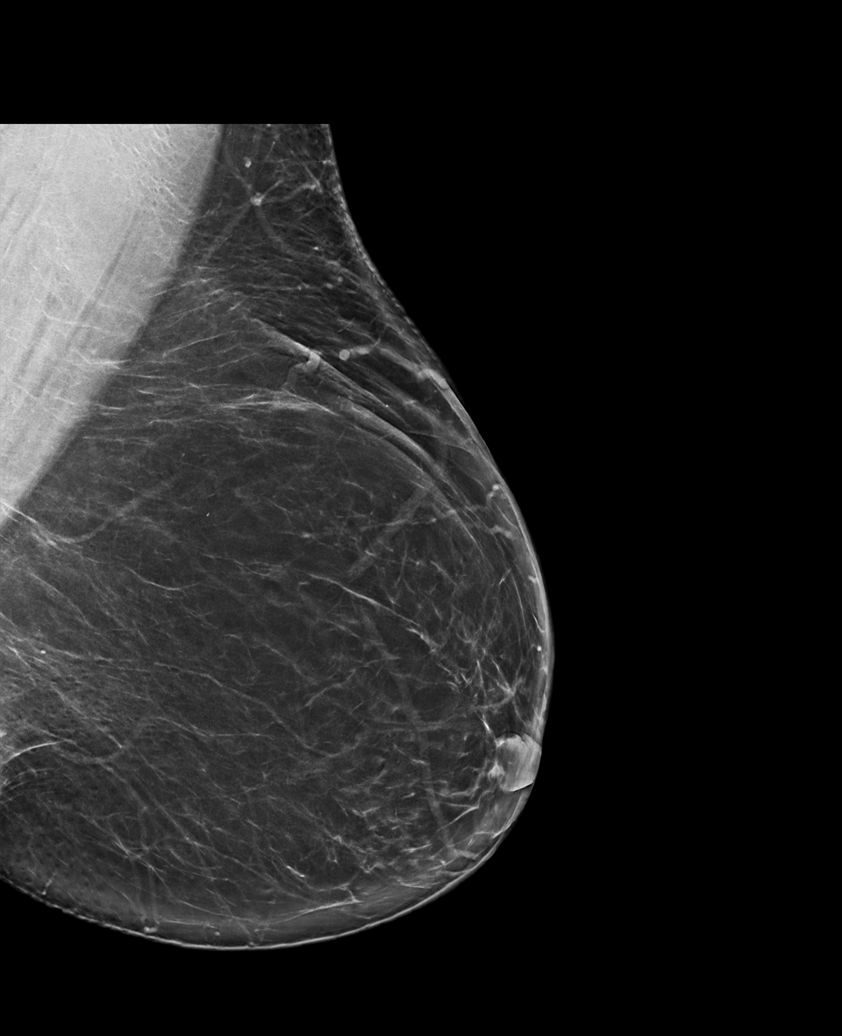

[L MLO synth-2D (2 of 2)]
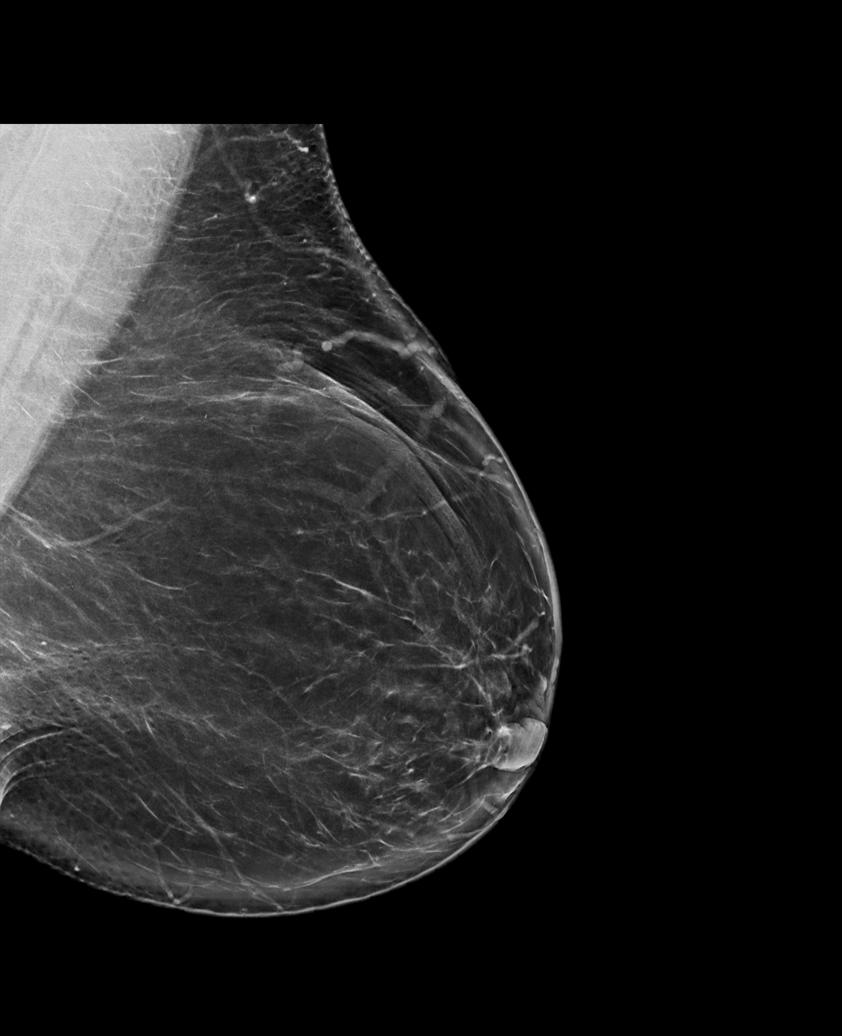

[R CC synth-2D]
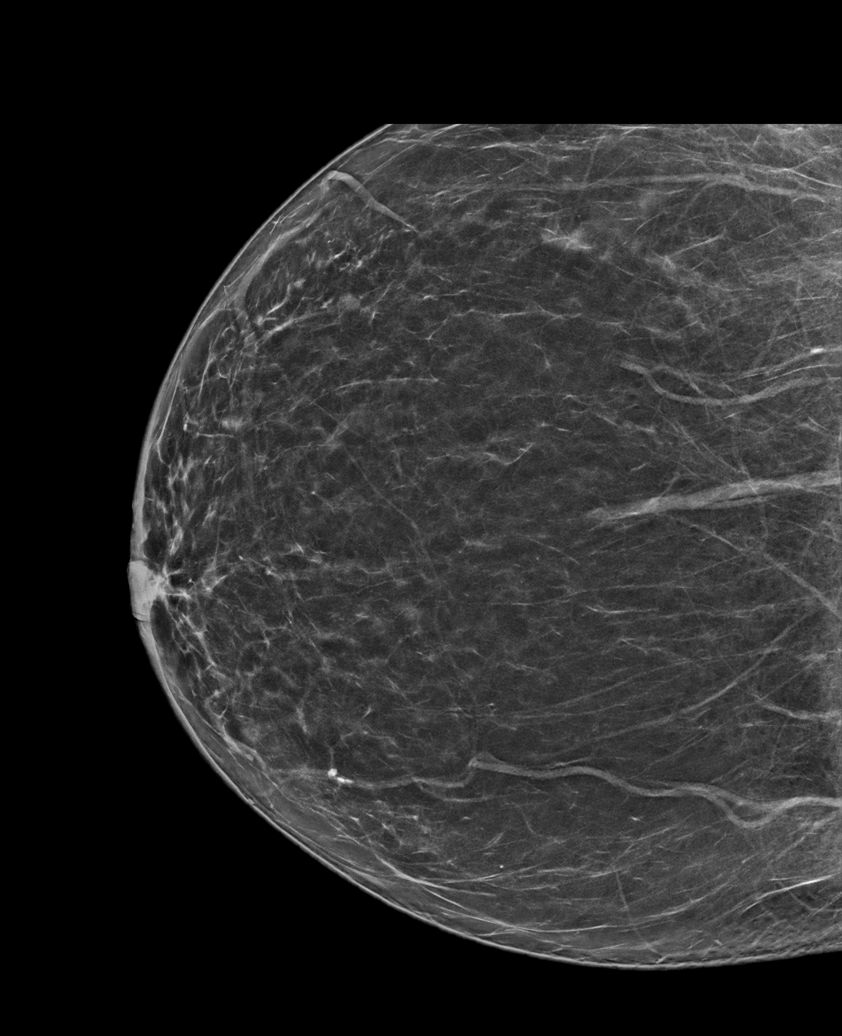

[6 of 36 positions shown; findings below may reference images not displayed]

ACR Breast Density Category b: There are scattered areas of
fibroglandular density.
FINDINGS: There are no findings suspicious for malignancy. Images were
processed with CAD.
IMPRESSION: No mammographic evidence of malignancy. A result letter of this
screening mammogram will be mailed directly to the patient.

RECOMMENDATION:
Screening mammogram in one year. (Code:PW-8-X81)

BI-RADS CATEGORY  1: Negative.

## 2021-06-29 ENCOUNTER — Encounter: Payer: Self-pay | Admitting: Physician Assistant

## 2022-01-13 ENCOUNTER — Ambulatory Visit: Payer: Managed Care, Other (non HMO) | Admitting: Physician Assistant

## 2022-01-13 ENCOUNTER — Encounter: Payer: Self-pay | Admitting: Physician Assistant

## 2022-01-13 VITALS — BP 128/84 | HR 106 | Temp 98.2°F | Ht 63.5 in | Wt 263.4 lb

## 2022-01-13 DIAGNOSIS — M79672 Pain in left foot: Secondary | ICD-10-CM

## 2022-01-13 DIAGNOSIS — M5430 Sciatica, unspecified side: Secondary | ICD-10-CM | POA: Diagnosis not present

## 2022-01-13 DIAGNOSIS — M25561 Pain in right knee: Secondary | ICD-10-CM

## 2022-01-13 MED ORDER — METHYLPREDNISOLONE 4 MG PO TBPK: ORAL_TABLET | ORAL | 0 refills | Status: DC

## 2022-01-13 NOTE — Patient Instructions (Signed)
Achilles Tendinitis  Achilles tendinitis is inflammation of the tough, cord-like band that attaches the lower leg muscles to the heel bone (Achilles tendon). This is usually caused by overusing the tendon and the ankle joint. Achilles tendinitis usually gets better over time with treatment and caring for yourself at home. It can take weeks or months to heal completely. What are the causes? This condition may be caused by: A sudden increase in exercise or activity, such as running. Doing the same exercises or activities, such as jumping, over and over. Not warming up calf muscles before exercising. Exercising in shoes that are worn out or not made for exercise. Having arthritis or a bone growth (spur) on the back of the heel bone. This can rub against the tendon and hurt it. Age-related wear and tear. Tendons become less flexible with age and are more likely to be injured. What are the signs or symptoms? Common symptoms of this condition include: Pain in the Achilles tendon or in the back of the leg, just above the heel. The pain usually gets worse with exercise. Stiffness or soreness in the back of the leg, especially in the morning. Swelling of the skin over the Achilles tendon. Thickening of the tendon. Trouble standing on tiptoe. How is this diagnosed? This condition is diagnosed based on your symptoms and a physical exam. You may have tests, including: X-rays. MRI. How is this treated? The goal of treatment is to relieve symptoms and help your injury heal. Treatment may include: Decreasing or stopping activities that caused the tendinitis. This may mean switching to low-impact exercises like biking or swimming. Icing the injured area. Doing physical therapy, including strengthening and stretching exercises. Taking NSAIDs, such as ibuprofen, to help relieve pain and swelling. Using supportive shoes, wraps, heel lifts, or a walking boot (air cast). Having surgery. This may be done if  your symptoms do not improve after other treatments. Using high-energy shock wave impulses to stimulate the healing process (extracorporeal shock wave therapy). This is rare. Having an injection of medicines that help relieve inflammation (corticosteroids). This is rare. Follow these instructions at home: If you have an air cast: Wear the air cast as told by your health care provider. Remove it only as told by your health care provider. Loosen it if your toes tingle, become numb, or turn cold and blue. Keep it clean. If the air cast is not waterproof: Do not let it get wet. Cover it with a watertight covering when you take a bath or shower. Managing pain, stiffness, and swelling  If directed, put ice on the injured area. To do this: If you have a removable air cast, remove it as told by your health care provider. Put ice in a plastic bag. Place a towel between your skin and the bag. Leave the ice on for 20 minutes, 2-3 times a day. Move your toes often to reduce stiffness and swelling. Raise (elevate) your foot above the level of your heart while you are sitting or lying down. Activity Gradually return to your normal activities as told by your health care provider. Ask your health care provider what activities are safe for you. Do not do activities that cause pain. Consider doing low-impact exercises, like cycling or swimming. Ask your health care provider when it is safe to drive if you have an air cast on your foot. If physical therapy was prescribed, do exercises as told by your health care provider or physical therapist. General instructions If directed, wrap your   foot with an elastic bandage or other wrap. This can help to keep your tendon from moving too much while it heals. Your health care provider will show you how to wrap your foot correctly. Wear supportive shoes or heel lifts only as told by your health care provider. Take over-the-counter and prescription medicines only as  told by your health care provider. Keep all follow-up visits as told by your health care provider. This is important. Contact a health care provider if you: Have symptoms that get worse. Have pain that does not get better with medicine. Develop new, unexplained symptoms. Develop warmth and swelling in your foot. Have a fever. Get help right away if you: Have a sudden popping sound or sensation in your Achilles tendon followed by severe pain. Cannot move your toes or foot. Cannot put any weight on your foot. Your foot or toes become numb and look white or blue even after loosening your bandage or air cast. Summary Achilles tendinitis is inflammation of the tough, cord-like band that attaches the lower leg muscles to the heel bone (Achilles tendon). This condition is usually caused by overusing the tendon and the ankle joint. It can also be caused by arthritis or normal aging. The most common symptoms of this condition include pain, swelling, or stiffness in the Achilles tendon or in the back of the leg. This condition is usually treated by decreasing or stopping activities that caused the tendinitis, icing the injured area, taking NSAIDs, and doing physical therapy. This information is not intended to replace advice given to you by your health care provider. Make sure you discuss any questions you have with your health care provider. Document Revised: 08/14/2018 Document Reviewed: 08/14/2018 Elsevier Patient Education  2023 [employer] Inc.  

## 2022-01-13 NOTE — Progress Notes (Signed)
Established patient visit   Patient: Brittney [name]   DOB: [birth year]   45 y.o. Female  MRN: [id number] Visit Date: 01/13/2022  Chief Complaint  Patient presents with   Follow-up    Right knee pain and left heel pain since may or june   Subjective    HPI HPI     Follow-up    Additional comments: Right knee pain and left heel pain since may or june      Last edited by Philippa Chester, CMA on 01/13/2022 10:10 AM.      Patient presents with c/o right knee pain that comes and goes. Patient reports a fall in June where she landed on her right knee. Reports not able to much weight on it like she used it. Pain radiates laterally. Cannot squat. Initially would not be able to rotate her in externally because it would hurt but it has improved. States has iced right knee intermittently. Also reports plantar fascitis started acting up in June, has been wearing tennis shows instead of heels and doing foot exercises and stretches. Patient reports a history of chronic back pain which can flare-up especially with prolonged sitting and sometimes will have numbness sensation from her buttock that radiates down to her leg.   Medications: Outpatient Medications Prior to Visit  Medication Sig   ALPRAZolam (XANAX) 1 MG tablet Take 1 mg by mouth at bedtime.   amphetamine-dextroamphetamine (ADDERALL) 10 MG tablet Take 10 mg by mouth daily.   BIOTIN PO Take 1 tablet by mouth daily.   buPROPion (WELLBUTRIN XL) 150 MG 24 hr tablet Take 2 tablets by mouth daily.   Loratadine (CLARITIN PO) Take 10 mg by mouth daily.   celecoxib (CELEBREX) 100 MG capsule Take 1 capsule (100 mg total) by mouth 2 (two) times daily.   diclofenac (VOLTAREN) 75 MG EC tablet Take 75 mg by mouth 2 (two) times daily.   nitroGLYCERIN (NITRODUR - DOSED IN MG/24 HR) 0.2 mg/hr patch PLACE 1 PATCH (0.2 MG TOTAL) ONTO THE SKIN DAILY. PLACE NEAR THE SPOT OF MAXIMAL TENDERNESS OVER THE RIGHT ELBOW. ALTERNATE THE SPOT EVERY DAY SO NOT  PUTTING THE PATCH IN THE SAME SPOT AS YESTERDAY. DISCONTINUE USE IF YOU NOTICE CHEST DISCOMFORT OR PALPITATIONS.   No facility-administered medications prior to visit.    Review of Systems Review of Systems:  A fourteen system review of systems was performed and found to be positive as per HPI.     Objective    BP 128/84   Pulse (!) 106   Temp 98.2 F (36.8 C) (Temporal)   Ht 5' 3.5" (1.613 m)   Wt 263 lb 6.4 oz (119.5 kg)   BMI 45.93 kg/m  BP Readings from Last 3 Encounters:  01/13/22 128/84  11/21/20 124/87  03/25/20 120/79   Wt Readings from Last 3 Encounters:  01/13/22 263 lb 6.4 oz (119.5 kg)  11/21/20 245 lb (111.1 kg)  04/15/20 253 lb (114.8 kg)    Physical Exam  General:  Well Developed, well nourished, appropriate for stated age.  Neuro:  Alert and oriented,  extra-ocular muscles intact  HEENT:  Normocephalic, atraumatic, neck supple  Skin:  no gross rash, warm, pink. Respiratory: Speaking in full sentence, unlabored. Vascular:  Ext warm, no cyanosis apprec. MSK: tenderness of achilles tendon at insertion of left heel, no knee tenderness, no joint line tenderness, good ROM of both knees, no deformity noted, neg anterior and posterior tests Psych:  No HI/SI, judgement and insight good,  Euthymic mood. Full Affect.   No results found for any visits on 01/13/22.  Assessment & Plan      Problem List Items Addressed This Visit   None Visit Diagnoses     Right knee pain, unspecified chronicity    -  Primary   Relevant Orders   Ambulatory referral to Orthopedic Surgery   Pain of left heel       Relevant Medications   methylPREDNISolone (MEDROL DOSEPAK) 4 MG TBPK tablet   Sciatica, unspecified laterality          Right knee pain: -Discussed with patient possibility of soft tissue injury (meniscus). Recommend to continue conservative treatment and will place orthopedic referral.  Pain of left heel: -Discussed with patient s/sx are more consistent with  achilles tendon etiology vs plantar fasciitis. Recommend to continue use of supportive shoes and ice therapy. Will start oral corticosteroid therapy. If symptoms fail to improve then recommend referral to PT or orthopedics.   Sciatica: -Discussed with patient conservative therapy with stretches and exercises. Recommend alternating sitting and standing positions to help reduce exacerbations.    Return if symptoms worsen or fail to improve.        Lorrene Reid, PA-C  [facility] [phone] (phone) [phone] (fax)  [place]

## 2022-02-01 ENCOUNTER — Ambulatory Visit: Payer: Managed Care, Other (non HMO) | Admitting: Orthopedic Surgery

## 2022-02-01 ENCOUNTER — Ambulatory Visit (INDEPENDENT_AMBULATORY_CARE_PROVIDER_SITE_OTHER): Payer: Managed Care, Other (non HMO)

## 2022-02-01 ENCOUNTER — Encounter: Payer: Self-pay | Admitting: Orthopedic Surgery

## 2022-02-01 VITALS — Ht 63.5 in | Wt 263.0 lb

## 2022-02-01 DIAGNOSIS — M25561 Pain in right knee: Secondary | ICD-10-CM

## 2022-02-01 DIAGNOSIS — M25572 Pain in left ankle and joints of left foot: Secondary | ICD-10-CM

## 2022-02-02 ENCOUNTER — Telehealth: Payer: Self-pay | Admitting: Orthopedic Surgery

## 2022-02-02 NOTE — Telephone Encounter (Signed)
Pt called stating that Dr Marlou Sa was to send in topical cream for foot. Please send to [place]. Pt phone number is [phone].

## 2022-02-04 ENCOUNTER — Other Ambulatory Visit: Payer: Self-pay

## 2022-02-04 ENCOUNTER — Encounter: Payer: Self-pay | Admitting: Orthopedic Surgery

## 2022-02-04 DIAGNOSIS — M25561 Pain in right knee: Secondary | ICD-10-CM

## 2022-02-04 NOTE — Telephone Encounter (Signed)
Tried calling to advise. No answer. Will try to call again later.

## 2022-02-04 NOTE — Progress Notes (Signed)
Office Visit Note   Patient: Brittney [name]           Date of Birth: 01-23-1976           MRN: [id number] Visit Date: 02/01/2022 Requested by: Lorrene Reid, PA-C [address] [place],  [place] [address] PCP: Lorrene Reid, PA-C  Subjective: Chief Complaint  Patient presents with   Left Ankle - Pain   Right Knee - Pain    HPI: Brittney [name] is a 46 y.o. female who presents to the office reporting right knee pain and left ankle pain.  She states she fell on the right knee in June.  The knee gets better but then gets worse.  Hard for her to put any pressure on that right knee.  Hard for her to bend it fully.  Reports pain primarily on the lateral aspect of the knee as well as pain that radiates to the back of the knee on that lateral side.  When she had the fall all of her weight went onto that right-hand side.  She also reports left Achilles pain.  She has a history of plantar fasciitis and has been wearing tennis shoes for 4 months without relief.  Oral steroids helped while she was on them but then the pain recurred when she stopped taking them.  She likes to wear boots and heels.  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  Her Achilles pain on the left started when she had the injury where she had direct impact on the right knee.  Ibuprofen does help her symptoms..                ROS: All systems reviewed are negative as they relate to the chief complaint within the history of present illness.  Patient denies fevers or chills.  Assessment & Plan: Visit Diagnoses:  1. Pain in left ankle and joints of left foot   2. Acute pain of right knee     Plan: Impression is right knee pain with some medial joint space narrowing on radiographs but all of her pain is lateral.  She does have an effusion.  Symptoms ongoing now for 4 months.  Lateral meniscal pathology likely.  Plan MRI right knee to evaluate lateral meniscal tear.  Regarding the left Achilles I think she  might have some tendinosis in the watershed area.  Eccentric strengthening and stretching exercises demonstrated.  Follow-up after her MRI scan.  Follow-Up Instructions: No follow-ups on file.   Orders:  Orders Placed This Encounter  Procedures   XR KNEE 3 VIEW RIGHT   XR Ankle Complete Left   No orders of the defined types were placed in this encounter.     Procedures: No procedures performed   Clinical Data: No additional findings.  Objective: Vital Signs: Ht 5' 3.5" (1.613 m)   Wt 263 lb (119.3 kg)   BMI 45.86 kg/m   Physical Exam:  Constitutional: Patient appears well-developed HEENT:  Head: Normocephalic Eyes:EOM are normal Neck: Normal range of motion Cardiovascular: Normal rate Pulmonary/chest: Effort normal Neurologic: Patient is alert Skin: Skin is warm Psychiatric: Patient has normal mood and affect  Ortho Exam: Ortho exam demonstrates on that left foot palpable intact nontender anterior to posterior to peroneal and Achilles tendons.  When her Achilles is painful and she localizes it more just proximal to the attachment site on the calcaneus.  This is closer to the watershed area than the attachment.  She has  symmetric tibiotalar subtalar transverse tarsal range of motion with palpable pedal pulses.  Good ankle stability is present.  Heel cord slightly tight.  No plantar fascial tenderness present.  Right knee is examined.  Collateral crucial ligaments are stable.  Extensor mechanism intact.  Has lateral joint line tenderness which is more on the joint line as opposed to at the iliotibial band lateral condyle interface.  Negative patellar apprehension.  No groin pain with internal/external rotation of the right leg and no nerve root tension signs.  Specialty Comments:  No specialty comments available.  Imaging: No results found.   PMFS History: Patient Active Problem List   Diagnosis Date Noted   Abdominal pain 11/22/2020   Obesity, Class III, BMI  40-49.9 (morbid obesity) (HCC) 09/27/2019   Anxiety and depression 09/27/2019   Psychophysiological insomnia 09/27/2019   Unsuccessful removal of intrauterine device 07/18/2017   Acute bronchitis 08/08/2012   Viral pharyngitis 06/28/2012   Redness of right eye 12/28/2011   Routine general medical examination at a health care facility 08/27/2010   Past Medical History:  Diagnosis Date   Anxiety    Depression    Family history of adverse reaction to anesthesia    MOM AND SISTER     History of chicken pox     Family History  Problem Relation Age of Onset   Diabetes Sister    Polycystic ovary syndrome Sister    Hypertension Sister    Depression Sister    Cancer Maternal Grandfather 72       prostate, stomach   Alcohol abuse Maternal Grandfather    Stroke Paternal Grandmother    Depression Mother    Coronary artery disease Neg Hx     Past Surgical History:  Procedure Laterality Date   CESAREAN SECTION     x3 (2004, 2007, 2011)   CHOLECYSTECTOMY  2004   HYSTEROSCOPY WITH D & C N/A 07/18/2017   Procedure: DILATATION AND CURETTAGE /HYSTEROSCOPY;  Surgeon: Marcelle Overlie, MD;  Location: [facility];  Service: Gynecology;  Laterality: N/A;   INTRAUTERINE DEVICE (IUD) INSERTION N/A 07/18/2017   Procedure: INTRAUTERINE DEVICE (IUD) INSERTION, ultrasound guidance;  Surgeon: Marcelle Overlie, MD;  Location: [facility];  Service: Gynecology;  Laterality: N/A;   Social History   Occupational History   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

## 2022-02-04 NOTE — Telephone Encounter (Signed)
I was able to reach patient and advise.

## 2022-02-04 NOTE — Telephone Encounter (Signed)
She should use otc topical voltaren pls call thx

## 2022-02-10 ENCOUNTER — Ambulatory Visit
Admission: RE | Admit: 2022-02-10 | Discharge: 2022-02-10 | Discharge status: Home / Self Care | Payer: Managed Care, Other (non HMO) | Source: Ambulatory Visit | Attending: Orthopedic Surgery | Admitting: Orthopedic Surgery

## 2022-02-10 DIAGNOSIS — M25561 Pain in right knee: Secondary | ICD-10-CM

## 2022-02-14 ENCOUNTER — Other Ambulatory Visit: Payer: Managed Care, Other (non HMO)

## 2022-02-24 ENCOUNTER — Ambulatory Visit: Payer: Managed Care, Other (non HMO) | Admitting: Orthopedic Surgery

## 2022-02-24 DIAGNOSIS — M25561 Pain in right knee: Secondary | ICD-10-CM

## 2022-02-24 DIAGNOSIS — S83241D Other tear of medial meniscus, current injury, right knee, subsequent encounter: Secondary | ICD-10-CM

## 2022-02-26 ENCOUNTER — Encounter: Payer: Self-pay | Admitting: Orthopedic Surgery

## 2022-02-26 MED ORDER — METHYLPREDNISOLONE ACETATE 40 MG/ML IJ SUSP
40.0000 mg | INTRAMUSCULAR | Status: AC | PRN
  Administered 2022-02-24: 40 mg via INTRA_ARTICULAR

## 2022-02-26 MED ORDER — LIDOCAINE HCL 1 % IJ SOLN
5.0000 mL | INTRAMUSCULAR | Status: AC | PRN
  Administered 2022-02-24: 5 mL

## 2022-02-26 MED ORDER — BUPIVACAINE HCL 0.25 % IJ SOLN
4.0000 mL | INTRAMUSCULAR | Status: AC | PRN
  Administered 2022-02-24: 4 mL via INTRA_ARTICULAR

## 2022-02-26 NOTE — Progress Notes (Addendum)
Office Visit Note   Patient: Brittney [name]           Date of Birth: 04-Nov-1975           MRN: [id number] Visit Date: 02/24/2022 Requested by: Mayer Masker, PA-C [address] [place],  [place] [address] PCP: Mayer Masker, PA-C  Subjective: Chief Complaint  Patient presents with   Right Knee - Pain    HPI: Brittney [name] is a 46 y.o. female who presents to the office reporting right knee lateral pain.  Pain was lateral but now it has moved to become more posterior.  Had an impact injury in June.  Was on steroids for a week for unrelated symptoms and that helped her.  MRI scan is reviewed.  There is high-grade cartilage loss in that medial compartment along with radial tear of the posterior horn medial meniscus.  Some extrusion of the meniscus is present.  Does not look like she has a meniscal root tear definitively on meticulous review of the MRI scan.  Definitely has continued symptoms..                ROS: All systems reviewed are negative as they relate to the chief complaint within the history of present illness.  Patient denies fevers or chills.  Assessment & Plan: Visit Diagnoses: No diagnosis found.  Plan: Impression is right knee pain with medial meniscal tear and pain that needs to be on the lateral side which is moved more posterior.  Does have an effusion today.  She is 70 but is looking at potentially an arthroscopic surgery which may or may not help her in terms of definitive pain relief.  The arthritic portion of her knee pain may not be helped with meniscal debridement.  Nonetheless I think in terms of intervention this is about the only thing that should be tried first prior to any type of reconstructive knee replacement.  She will consider her options.  Knee is aspirated and injected today. Follow-Up Instructions: No follow-ups on file.   Orders:  No orders of the defined types were placed in this encounter.  No orders of the defined types were  placed in this encounter.     Procedures: Large Joint Inj: R knee on 02/24/2022 10:44 PM Indications: diagnostic evaluation, joint swelling and pain Details: 18 G 1.5 in needle, superolateral approach  Arthrogram: No  Medications: 5 mL lidocaine 1 %; 40 mg methylPREDNISolone acetate 40 MG/ML; 4 mL bupivacaine 0.25 % Outcome: tolerated well, no immediate complications Procedure, treatment alternatives, risks and benefits explained, specific risks discussed. Consent was given by the patient. Immediately prior to procedure a time out was called to verify the correct patient, procedure, equipment, support staff and site/side marked as required. Patient was prepped and draped in the usual sterile fashion.       Clinical Data: No additional findings.  Objective: Vital Signs: There were no vitals taken for this visit.  Physical Exam:  Constitutional: Patient appears well-developed HEENT:  Head: Normocephalic Eyes:EOM are normal Neck: Normal range of motion Cardiovascular: Normal rate Pulmonary/chest: Effort normal Neurologic: Patient is alert Skin: Skin is warm Psychiatric: Patient has normal mood and affect  Ortho Exam: Ortho exam demonstrates slightly antalgic gait to the right.  Has mild effusion.  Extensor mechanism intact.  Collateral cruciate ligaments are stable.  Has both medial and lateral posterior joint line tenderness.  Specialty Comments:  No specialty comments available.  Imaging: No results found.  PMFS History: Patient Active Problem List   Diagnosis Date Noted   Abdominal pain 11/22/2020   Obesity, Class III, BMI 40-49.9 (morbid obesity) (HCC) 09/27/2019   Anxiety and depression 09/27/2019   Psychophysiological insomnia 09/27/2019   Unsuccessful removal of intrauterine device 07/18/2017   Acute bronchitis 08/08/2012   Viral pharyngitis 06/28/2012   Redness of right eye 12/28/2011   Routine general medical examination at a health care facility  08/27/2010   Past Medical History:  Diagnosis Date   Anxiety    Depression    Family history of adverse reaction to anesthesia    MOM AND SISTER     History of chicken pox     Family History  Problem Relation Age of Onset   Diabetes Sister    Polycystic ovary syndrome Sister    Hypertension Sister    Depression Sister    Cancer Maternal Grandfather 24       prostate, stomach   Alcohol abuse Maternal Grandfather    Stroke Paternal Grandmother    Depression Mother    Coronary artery disease Neg Hx     Past Surgical History:  Procedure Laterality Date   CESAREAN SECTION     x3 (2004, 2007, 2011)   CHOLECYSTECTOMY  2004   HYSTEROSCOPY WITH D & C N/A 07/18/2017   Procedure: DILATATION AND CURETTAGE /HYSTEROSCOPY;  Surgeon: Marcelle Overlie, MD;  Location: [facility];  Service: Gynecology;  Laterality: N/A;   INTRAUTERINE DEVICE (IUD) INSERTION N/A 07/18/2017   Procedure: INTRAUTERINE DEVICE (IUD) INSERTION, ultrasound guidance;  Surgeon: Marcelle Overlie, MD;  Location: [facility];  Service: Gynecology;  Laterality: N/A;   Social History   Occupational History   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

## 2022-03-01 ENCOUNTER — Other Ambulatory Visit: Payer: Self-pay

## 2022-03-01 DIAGNOSIS — M25572 Pain in left ankle and joints of left foot: Secondary | ICD-10-CM

## 2022-03-08 ENCOUNTER — Encounter: Payer: Self-pay | Admitting: Sports Medicine

## 2022-03-08 ENCOUNTER — Ambulatory Visit: Payer: Managed Care, Other (non HMO) | Admitting: Sports Medicine

## 2022-03-08 DIAGNOSIS — M7662 Achilles tendinitis, left leg: Secondary | ICD-10-CM

## 2022-03-08 DIAGNOSIS — M25572 Pain in left ankle and joints of left foot: Secondary | ICD-10-CM

## 2022-03-08 DIAGNOSIS — Q6671 Congenital pes cavus, right foot: Secondary | ICD-10-CM | POA: Diagnosis not present

## 2022-03-08 DIAGNOSIS — Q6672 Congenital pes cavus, left foot: Secondary | ICD-10-CM | POA: Diagnosis not present

## 2022-03-08 NOTE — Progress Notes (Signed)
Brittney [name] - 46 y.o. female MRN [id number]  Date of birth: 06-06-1975  Office Visit Note: Visit Date: 03/08/2022 PCP: Mayer Masker, PA-C Referred by: Cammy Copa, MD  Subjective: Chief Complaint  Patient presents with   Left Ankle - Pain   HPI: Brittney [name] is a pleasant 45 y.o. female who presents today for evaluation of left achilles tendinopathy.  She has been dealing with this pain since June of this year.  Also has been dealing intermittently with planter fasciitis of both heels.  Recently had a knee injection done by Dr. August Saucer 1 week ago which helped improved her pain.  Feels like she has been walking differently because of these ailments.  There is no skin thickening and some swelling in this area at times.  No redness or bruising.  She does wear her heels frequently, feels better with this.  Has been more tennis shoes recently secondary to her planter fasciitis.  Has pain with flip-flops or barefoot walking.  Originally seen by my partner, Dr. August Saucer, referred for achilles issue and possible shockwave therapy.   Pertinent ROS were reviewed with the patient and found to be negative unless otherwise specified above in HPI.   Assessment & Plan: Visit Diagnoses:  1. Achilles tendinitis, left leg   2. Pain in left ankle and joints of left foot   3. Pes cavus of both feet    Plan: Discussed all treatment options for her left Achilles tendinopathy.  Instructed her to continue her home stretching and eccentric calf/Achilles exercises.  Through shared decision-making, elected proceed with a trial of extracorporeal shockwave treatment.  She did find improvement immediately after this treatment today.  I would also like to try to get her into some orthotic insoles to help support her arch.  She will present in 1 week and bring her tennis shoes and so we can try the arch supports and consider an additional shockwave treatment at that time.  May continue over-the-counter  ibuprofen as needed.  Follow-up: Return in about 1 year (around 03/09/2023) for 1-2 weeks with Shon Baton for heel/achilles.   Meds & Orders: No orders of the defined types were placed in this encounter.  No orders of the defined types were placed in this encounter.    Procedures: No procedures performed      Clinical History: No specialty comments available.  She reports that she has never smoked. She has never used smokeless tobacco. No results for input(s): "HGBA1C", "LABURIC" in the last 8760 hours.  Objective:   Vital Signs: There were no vitals taken for this visit.  Physical Exam  Gen: Well-appearing, in no acute distress; non-toxic CV: Regular Rate. Well-perfused. Warm.  Resp: Breathing unlabored on room air; no wheezing. Psych: Fluid speech in conversation; appropriate affect; normal thought process Neuro: Sensation intact throughout. No gross coordination deficits.   Ortho Exam - LLE: There is some thickening may be made to the distal aspect of the Achilles tendon on the left.  Thompson's squeeze test intact.  No redness or erythema.  Negative calcaneal heel squeeze.   - Gait/bilateral feet: Notable pes cavus bilaterally.  Mild supination upon walking.  Imaging:  *Independent review of left complete ankle x-ray from 02/01/2022 was reviewed by myself.  There is pes cavus noted.  There is a Haglund's deformity of the superior calcaneus.  Mortise is symmetric.  Past Medical/Family/Surgical/Social History: Medications & Allergies reviewed per EMR, new medications updated. Patient Active Problem List   Diagnosis Date Noted  Abdominal pain 11/22/2020   Obesity, Class III, BMI 40-49.9 (morbid obesity) ([facility]) 09/27/2019   Anxiety and depression 09/27/2019   Psychophysiological insomnia 09/27/2019   Unsuccessful removal of intrauterine device 07/18/2017   Acute bronchitis 08/08/2012   Viral pharyngitis 06/28/2012   Redness of right eye 12/28/2011   Routine general medical  examination at a health care facility 08/27/2010   Past Medical History:  Diagnosis Date   Anxiety    Depression    Family history of adverse reaction to anesthesia    MOM AND SISTER     History of chicken pox    Family History  Problem Relation Age of Onset   Diabetes Sister    Polycystic ovary syndrome Sister    Hypertension Sister    Depression Sister    Cancer Maternal Grandfather 31       prostate, stomach   Alcohol abuse Maternal Grandfather    Stroke Paternal Grandmother    Depression Mother    Coronary artery disease Neg Hx    Past Surgical History:  Procedure Laterality Date   CESAREAN SECTION     x3 (2004, 2007, 2011)   CHOLECYSTECTOMY  2004   HYSTEROSCOPY WITH D & C N/A 07/18/2017   Procedure: DILATATION AND CURETTAGE /HYSTEROSCOPY;  Surgeon: Marcelle Overlie, MD;  Location: [facility];  Service: Gynecology;  Laterality: N/A;   INTRAUTERINE DEVICE (IUD) INSERTION N/A 07/18/2017   Procedure: INTRAUTERINE DEVICE (IUD) INSERTION, ultrasound guidance;  Surgeon: Marcelle Overlie, MD;  Location: [facility];  Service: Gynecology;  Laterality: N/A;   Social History   Occupational History   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

## 2022-03-08 NOTE — Progress Notes (Signed)
Left achilles pain; pain since June  Has tried stretches and voltaren gel with no relief

## 2022-03-15 ENCOUNTER — Encounter: Payer: Self-pay | Admitting: Sports Medicine

## 2022-03-15 ENCOUNTER — Ambulatory Visit: Payer: Managed Care, Other (non HMO) | Admitting: Sports Medicine

## 2022-03-15 DIAGNOSIS — Q6671 Congenital pes cavus, right foot: Secondary | ICD-10-CM

## 2022-03-15 DIAGNOSIS — Q6672 Congenital pes cavus, left foot: Secondary | ICD-10-CM

## 2022-03-15 DIAGNOSIS — M7662 Achilles tendinitis, left leg: Secondary | ICD-10-CM

## 2022-03-15 NOTE — Progress Notes (Signed)
Helped for about 2 days But feeling some improvement

## 2022-03-15 NOTE — Progress Notes (Signed)
   Procedure Note  Patient: Brittney [name]             Date of Birth: Jun 14, 1975           MRN: [id number]             Visit Date: 03/15/2022  Procedures: Visit Diagnoses:  1. Achilles tendinitis, left leg   2. Pes cavus of both feet    Procedure: ECSWT Indications:  achilles tendinitis, left   Procedure Details Consent: Risks of procedure as well as the alternatives and risks of each were explained to the patient.  Verbal consent for procedure obtained. Time Out: Verified patient identification, verified procedure, site was marked, verified correct patient position. The area was cleaned with alcohol swab.     The left achilles tendon and superior calcaneus was targeted for Extracorporeal shockwave therapy.    Preset: heel spur Power Level: 120 mJ Frequency: 10 --> 12 Hz Impulse/cycles: 2500 Head size: Regular   Patient tolerated procedure well without immediate complications.    - fitted for orthotic insoles today, ambulated with these and found to be comfortable - f/u next week for repeat ECSWT

## 2022-03-22 ENCOUNTER — Ambulatory Visit: Payer: Managed Care, Other (non HMO) | Admitting: Sports Medicine

## 2022-03-22 ENCOUNTER — Encounter: Payer: Self-pay | Admitting: Sports Medicine

## 2022-03-22 DIAGNOSIS — M9262 Juvenile osteochondrosis of tarsus, left ankle: Secondary | ICD-10-CM

## 2022-03-22 DIAGNOSIS — M7662 Achilles tendinitis, left leg: Secondary | ICD-10-CM

## 2022-03-22 NOTE — Progress Notes (Signed)
   Brittney [name] is a pleasant 46 year-old female who presents for ECSWT #3 today for left achilles tendinopathy. She has been finding benefit from these treatments. Also, remains in the orthotics for support.  Procedure Note  Patient: Brittney [name]             Date of Birth: [birth year]           MRN: [id number]             Visit Date: 03/22/2022  Procedures: Visit Diagnoses:  1. Haglund's deformity, left   2. Achilles tendinitis, left leg     Procedure: ECSWT Indications:  achilles tendinitis, left   Procedure Details Consent: Risks of procedure as well as the alternatives and risks of each were explained to the patient.  Verbal consent for procedure obtained. Time Out: Verified patient identification, verified procedure, site was marked, verified correct patient position. The area was cleaned with alcohol swab.     The left achilles tendon and superior calcaneus was targeted for Extracorporeal shockwave therapy.   [name] is a pleasant 46 year-old female who presents for ECSWT #3 today for left achilles tendinopathy. She has been finding benefit from these treatments. Also, remains in the orthotics for support.   Preset: heel spur Power Level: 120 mJ Frequency: 12 Hz Impulse/cycles: 2900 Head size: Regular   Patient tolerated procedure well without immediate complications.  - f/u in 1 week for repeat ECSWT

## 2022-03-22 NOTE — Progress Notes (Signed)
Shockwave follow up; feeling better after these treatments She does feel like they are helping

## 2022-03-29 ENCOUNTER — Encounter: Payer: Self-pay | Admitting: Sports Medicine

## 2022-03-29 ENCOUNTER — Ambulatory Visit: Payer: Self-pay | Admitting: Sports Medicine

## 2022-03-29 DIAGNOSIS — M9262 Juvenile osteochondrosis of tarsus, left ankle: Secondary | ICD-10-CM

## 2022-03-29 DIAGNOSIS — M7662 Achilles tendinitis, left leg: Secondary | ICD-10-CM

## 2022-03-29 NOTE — Progress Notes (Signed)
Feels like the shockwave treatments are helping Pain is improving

## 2022-03-29 NOTE — Progress Notes (Signed)
   Procedure Note  Brittney [name] is a pleasant 46 year-old female who presents for ECSWT #4 today for left achilles tendinopathy. She has been finding benefit from these treatments.   Patient: Brittney [name]             Date of Birth: [birth year]           MRN: [id number]             Visit Date: 03/29/2022  Procedures: Visit Diagnoses:  1. Achilles tendinitis, left leg   2. Haglund's deformity, left    Procedure: ECSWT Indications:  achilles tendinitis, left   Procedure Details Consent: Risks of procedure as well as the alternatives and risks of each were explained to the patient.  Verbal consent for procedure obtained. Time Out: Verified patient identification, verified procedure, site was marked, verified correct patient position. The area was cleaned with alcohol swab.     The left achilles tendon and superior calcaneus was targeted for Extracorporeal shockwave therapy.    [name] is a pleasant 46 year-old female who presents for ECSWT #3 today for left achilles tendinopathy. She has been finding benefit from these treatments. Also, remains in the orthotics for support.   Preset: heel spur Power Level: 120 mJ Frequency: 12 Hz Impulse/cycles: 2900 Head size: Regular   Patient tolerated procedure well without immediate complications.   - f/u in 2 weeks for repeat ECSWT  - She will also bring her blood work so we can review her kidney and liver function.  If these are controlled, may consider a prescription strength NSAID going forward

## 2022-04-15 ENCOUNTER — Encounter: Payer: Self-pay | Admitting: Sports Medicine

## 2022-04-15 ENCOUNTER — Ambulatory Visit: Payer: Managed Care, Other (non HMO) | Admitting: Sports Medicine

## 2022-04-15 DIAGNOSIS — S83241D Other tear of medial meniscus, current injury, right knee, subsequent encounter: Secondary | ICD-10-CM | POA: Diagnosis not present

## 2022-04-15 DIAGNOSIS — M9262 Juvenile osteochondrosis of tarsus, left ankle: Secondary | ICD-10-CM | POA: Diagnosis not present

## 2022-04-15 DIAGNOSIS — M7662 Achilles tendinitis, left leg: Secondary | ICD-10-CM

## 2022-04-15 MED ORDER — CELECOXIB 200 MG PO CAPS: 200.0000 mg | ORAL_CAPSULE | Freq: Two times a day (BID) | ORAL | 1 refills | Status: AC | PRN

## 2022-04-15 NOTE — Progress Notes (Signed)
BETSAIDA [place] - 47 y.o. female MRN [id number]  Date of birth: [birth year]  Office Visit Note: Visit Date: 04/15/2022 PCP: Lorrene Reid, PA-C Referred by: Lorrene Reid, PA-C  Subjective: No chief complaint on file.  HPI: Brittney [name] is a pleasant 47 y.o. female who presents today for follow-up of left achilles tendinopathy.   She has been finding complete form extracorporeal shockwave treatment today.  She remains in her orthotics for support, finds these comfortable. Performing home therapy exercises for the achilles, calf, and posterior chain. She is taking PRN ibuprofen for pain control, but unsure if this is helping her.  Reviewed her prior bloodwork - performed 02/26/22. Cr - 0.78 AST - 14 ALT - 17   Also continues having some chronic right knee pain.  Previously saw my partner, Dr. Alphonzo Severance for this.  MRI that showed medial joint space cartilage loss as well as a radial tear of the posterior horn of the medial meniscus.  Evaluate for aspiration and injection, her pain is returned.  Reports pain is over the medial side and over the patella, worse with going up and down stairs.  Pertinent ROS were reviewed with the patient and found to be negative unless otherwise specified above in HPI.   Assessment & Plan: Visit Diagnoses:  1. Achilles tendinitis, left leg   2. Haglund's deformity, left   3. Tear of medial meniscus of right knee, unspecified tear type, unspecified whether old or current tear, subsequent encounter    Plan: Discussed treatment options for her insertional Achilles tendinopathy and her Haglund's deformity.  She has certainly gotten some benefit from shockwave treatment in the orthotic change, however she is at or even less than 50% improved from prior initial treatment.  I do think she is dealing more so with the Haglund deformity as opposed to irritation of the Achilles tendon.  We did perform shockwave therapy today, will present in 1 week and  additional trial.  Will continue with home exercise protocol.  She will discontinue over-the-counter ibuprofen, we will start her on Celebrex 200 mg once daily, may take 2 times daily as needed for more pain for days.  Hopefully this will help her as well.  Will focus on the heel first, some of her knee pain may be from compensation due to her limping on the left side.  Consider repeat injection at some point for dedicated therapy for the knee.  In terms of the heel, we also consider nitroglycerin patch protocol to help with blood flow.  This nor the shockwave therapy did not get her at least 75% improved, I think discussing surgical options with Dr. Sharol Given would be the next step.  Follow-up: Return in about 1 week (around 04/22/2022) for for ECSWT of left heel.   Meds & Orders:  Meds ordered this encounter  Medications   celecoxib (CELEBREX) 200 MG capsule    Sig: Take 1 capsule (200 mg total) by mouth 2 (two) times daily as needed.    Dispense:  60 capsule    Refill:  1   No orders of the defined types were placed in this encounter.    Procedures: Procedure: ECSWT Indications:  achilles tendinitis, left   Procedure Details Consent: Risks of procedure as well as the alternatives and risks of each were explained to the patient.  Verbal consent for procedure obtained. Time Out: Verified patient identification, verified procedure, site was marked, verified correct patient position. The area was cleaned with alcohol swab.  The left achilles tendon and superior calcaneus was targeted for Extracorporeal shockwave therapy.    Brittney [name] is a pleasant 47 year-old female who presents for ECSWT #3 today for left achilles tendinopathy. She has been finding benefit from these treatments. Also, remains in the orthotics for support.   Preset: heel spur Power Level: 120 mJ Frequency: 12 Hz  Impulse/cycles: 2500 Head size: Regular   Patient tolerated procedure well without immediate complications.       Clinical History: No specialty comments available.  She reports that she has never smoked. She has never used smokeless tobacco. No results for input(s): "HGBA1C", "LABURIC" in the last 8760 hours.  Objective:   Vital Signs: There were no vitals taken for this visit.  Physical Exam  Gen: Well-appearing, in no acute distress; non-toxic CV: Regular Rate. Well-perfused. Warm.  Resp: Breathing unlabored on room air; no wheezing. Psych: Fluid speech in conversation; appropriate affect; normal thought process Neuro: Sensation intact throughout. No gross coordination deficits.   Ortho Exam - Left foot/ankle: Positive TTP over the superior aspect of the calcaneus, no TTP over the Achilles tendon.  Range of motion relatively preserved with plantarflexion and dorsiflexion to 95 degrees.  Neurovascular intact distally.  - Right knee: No gross effusion of the knee.  There is tenderness to palpation on the medial joint line.  Patellar crepitus noted.   Imaging:  *Independent review of three-view right knee x-ray from 01/31/2022 shows medial joint space narrowing, mild-moderate.  Small spur off the superior aspect of the patella.  No acute fracture.  *Independent review of MRI of right knee from 02/10/2022 demonstrates a radial tear along the posterior horn of the medial meniscus, there is a small degree of extrusion present.  There is also high-grade cartilage loss along the medial compartment of the tibiofemoral joint.  MR Knee Right w/o contrast CLINICAL DATA:  Right lateral knee pain.  EXAM: MRI OF THE RIGHT KNEE WITHOUT CONTRAST  TECHNIQUE: Multiplanar, multisequence MR imaging of the knee was performed. No intravenous contrast was administered.  COMPARISON:  None Available.  FINDINGS: MENISCI  Medial: Degeneration of the medial meniscus. Irregularity and partial attenuation of the posterior aspect of the posterior horn of the medial meniscus at the root concerning for a partial  radial tear with peripheral meniscal extrusion.  Lateral: Intact.  LIGAMENTS  Cruciates: ACL and PCL are intact.  Collaterals: Medial collateral ligament is intact. Lateral collateral ligament complex is intact.  CARTILAGE  Patellofemoral:  No chondral defect.  Medial: High-grade partial-thickness cartilage loss with areas of full-thickness cartilage loss of the medial femorotibial compartment and small marginal osteophytes.  Lateral:  No chondral defect.  JOINT: No joint effusion. Normal Hoffa's fat-pad. No plical thickening.  POPLITEAL FOSSA: Popliteus tendon is intact. Small Baker's cyst.  EXTENSOR MECHANISM: Intact quadriceps tendon. Intact patellar tendon. Intact lateral patellar retinaculum. Intact medial patellar retinaculum. Intact MPFL.  BONES: No aggressive osseous lesion. No fracture or dislocation.  Other: No fluid collection or hematoma. Muscles are normal.  IMPRESSION: 1. High-grade partial-thickness cartilage loss with areas of full-thickness cartilage loss of the medial femorotibial compartment and small marginal osteophytes. 2. Irregularity and partial attenuation of the posterior aspect of the posterior horn of the medial meniscus at the root concerning for a partial radial tear with peripheral meniscal extrusion.  Electronically Signed   By: Elige Ko M.D.   On: 02/13/2022 08:26    Past Medical/Family/Surgical/Social History: Medications & Allergies reviewed per EMR, new medications updated. Patient Active Problem List  Diagnosis Date Noted   Abdominal pain 11/22/2020   Obesity, Class III, BMI 40-49.9 (morbid obesity) ([place]) 09/27/2019   Anxiety and depression 09/27/2019   Psychophysiological insomnia 09/27/2019   Unsuccessful removal of intrauterine device 07/18/2017   Acute bronchitis 08/08/2012   Viral pharyngitis 06/28/2012   Redness of right eye 12/28/2011   Routine general medical examination at a health care facility 08/27/2010    Past Medical History:  Diagnosis Date   Anxiety    Depression    Family history of adverse reaction to anesthesia    MOM AND SISTER     History of chicken pox    Family History  Problem Relation Age of Onset   Diabetes Sister    Polycystic ovary syndrome Sister    Hypertension Sister    Depression Sister    Cancer Maternal Grandfather 51       prostate, stomach   Alcohol abuse Maternal Grandfather    Stroke Paternal Grandmother    Depression Mother    Coronary artery disease Neg Hx    Past Surgical History:  Procedure Laterality Date   CESAREAN SECTION     x3 (2004, 2007, 2011)   CHOLECYSTECTOMY  2004   HYSTEROSCOPY WITH D & C N/A 07/18/2017   Procedure: DILATATION AND CURETTAGE /HYSTEROSCOPY;  Surgeon: Dian Queen, MD;  Location: [facility];  Service: Gynecology;  Laterality: N/A;   INTRAUTERINE DEVICE (IUD) INSERTION N/A 07/18/2017   Procedure: INTRAUTERINE DEVICE (IUD) INSERTION, ultrasound guidance;  Surgeon: Dian Queen, MD;  Location: [facility];  Service: Gynecology;  Laterality: N/A;   Social History   Occupational History   Occupation: accounts Psychologist, 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

## 2022-04-15 NOTE — Progress Notes (Signed)
Feels like these treatments help for short term; she can tell once it wears off because the pain returns. Usually gives about 1 week of relief.

## 2022-04-22 ENCOUNTER — Encounter: Payer: Self-pay | Admitting: Sports Medicine

## 2022-04-22 ENCOUNTER — Ambulatory Visit: Payer: Managed Care, Other (non HMO) | Admitting: Sports Medicine

## 2022-04-22 DIAGNOSIS — S83241D Other tear of medial meniscus, current injury, right knee, subsequent encounter: Secondary | ICD-10-CM | POA: Diagnosis not present

## 2022-04-22 DIAGNOSIS — G8929 Other chronic pain: Secondary | ICD-10-CM

## 2022-04-22 DIAGNOSIS — M9262 Juvenile osteochondrosis of tarsus, left ankle: Secondary | ICD-10-CM

## 2022-04-22 DIAGNOSIS — M25561 Pain in right knee: Secondary | ICD-10-CM | POA: Diagnosis not present

## 2022-04-22 DIAGNOSIS — M7662 Achilles tendinitis, left leg: Secondary | ICD-10-CM | POA: Diagnosis not present

## 2022-04-22 NOTE — Progress Notes (Signed)
Brittney [name] - 47 y.o. female MRN [id number]  Date of birth: 1975-10-08  Office Visit Note: Visit Date: 04/22/2022 PCP: Lorrene Reid, PA-C Referred by: Lorrene Reid, PA-C  Subjective: Chief Complaint  Patient presents with   Left Heel - Follow-up   HPI: Brittney [name] is a pleasant 47 y.o. female who presents today for follow-up of let heel pain; also would like to discuss right knee pain.  ECSWT #6 today for left achilles tendinopathy. She has been finding benefit from these treatments. Also, remains in the orthotics for support.  After our last visit, we did start her on Celebrex 100 mg to be taken once-twice daily as needed.  She has found great improvement with using this medication.   Also would like to discuss her right knee more.  This has been bothering her.  She will give sensations of catching and clicking about the knee with certain activities or upon standing at times.  This will cause her pain when she gets the catching sensation.  Occasional get very mild swelling, but reports her knee has never been super swollen.  Pain has spread from the medial aspect of the knee, sometimes the posterior, and is depending on the day and activity.  Pertinent ROS were reviewed with the patient and found to be negative unless otherwise specified above in HPI.   Assessment & Plan: Visit Diagnoses:  1. Achilles tendinitis, left leg   2. Haglund's deformity, left   3. Tear of medial meniscus of right knee, unspecified tear type, unspecified whether old or current tear, subsequent encounter   4. Chronic pain of right knee    Plan: I do think we are making improvements with the insertional Achilles tendinopathy and Haglund's with shockwave and the Celebrex.  Did discuss with her I would like her to continue the Celebrex 100 mg once a day, may take this twice daily for her more pain for days.  We did discuss today that I would not like her to take this more than 2 months, will  follow-up with this.  Continue her home exercise protocol.  In terms of the right knee, I did review her MRI with her today and I think she does have evidence of both high-grade medial joint cartilage loss as well as meniscal tearing.  She is getting clicking and catching about the knee, which I think is more so due from the meniscus.  Did discuss all treatment options such as continuing or therapy, physical therapy/rehab as well as injection therapy.  She would like to start dedicated formal PT, referral sent today.  I also will have her come back in the coming weeks and we will consider a repeat corticosteroid injection to help with pain control and her to progress through physical therapy.   Follow-up: Return in about 2 weeks (around 05/06/2022) for for right knee, left heel (inj - 30 mins).   Meds & Orders: No orders of the defined types were placed in this encounter.  No orders of the defined types were placed in this encounter.    Procedures: Procedure: ECSWT Indications:  achilles tendinitis, left   Procedure Details Consent: Risks of procedure as well as the alternatives and risks of each were explained to the patient.  Verbal consent for procedure obtained. Time Out: Verified patient identification, verified procedure, site was marked, verified correct patient position. The area was cleaned with alcohol swab.     The left achilles tendon and superior calcaneus was targeted for Extracorporeal  shockwave therapy.     Preset: heel spur Power Level: 100 -120 mJ Frequency: 10 -12 Hz  Impulse/cycles: 2500 Head size: Regular   Patient tolerated procedure well without immediate complications.       Clinical History: No specialty comments available.  She reports that she has never smoked. She has never used smokeless tobacco. No results for input(s): "HGBA1C", "LABURIC" in the last 8760 hours.  Objective:    Physical Exam  Gen: Well-appearing, in no acute distress; non-toxic CV:  Regular Rate. Well-perfused. Warm.  Resp: Breathing unlabored on room air; no wheezing. Psych: Fluid speech in conversation; appropriate affect; normal thought process Neuro: Sensation intact throughout. No gross coordination deficits.   Ortho Exam - LLE: There is still some positive TTP over the superior aspect of the calcaneus, more so on the lateral side.  No TTP over the Achilles tendon.  She has preserved range of motion with plantarflexion and dorsiflexion of the ankle.  Nonantalgic gait today.  Neurovascular intact distally.  - Right knee: There is no redness, swelling or effusion of the knee.  There are some tenderness to palpation on the medial joint line.  There is some patellar crepitus noted bilaterally.  There is pain with endrange flexion as well as pain with McMurray's testing without appreciable click.  Thessaly's test is equivocal, more hesitancy than true pain or giving way.  Imaging:  *Independent review of the right knee MRI from 02/13/2022 was independently reviewed and interpreted by myself.  There is rather high-grade cartilage loss of the medial joint space of the knee into some extent on the patellofemoral joint.  I do see evidence of a small tear in the posterior horn of the medial meniscus, unclear whether true extrusion is present.   MR Knee Right w/o contrast CLINICAL DATA:  Right lateral knee pain.  EXAM: MRI OF THE RIGHT KNEE WITHOUT CONTRAST  TECHNIQUE: Multiplanar, multisequence MR imaging of the knee was performed. No intravenous contrast was administered.  COMPARISON:  None Available.  FINDINGS: MENISCI  Medial: Degeneration of the medial meniscus. Irregularity and partial attenuation of the posterior aspect of the posterior horn of the medial meniscus at the root concerning for a partial radial tear with peripheral meniscal extrusion.  Lateral: Intact.  LIGAMENTS  Cruciates: ACL and PCL are intact.  Collaterals: Medial collateral ligament is  intact. Lateral collateral ligament complex is intact.  CARTILAGE  Patellofemoral:  No chondral defect.  Medial: High-grade partial-thickness cartilage loss with areas of full-thickness cartilage loss of the medial femorotibial compartment and small marginal osteophytes.  Lateral:  No chondral defect.  JOINT: No joint effusion. Normal Hoffa's fat-pad. No plical thickening.  POPLITEAL FOSSA: Popliteus tendon is intact. Small Baker's cyst.  EXTENSOR MECHANISM: Intact quadriceps tendon. Intact patellar tendon. Intact lateral patellar retinaculum. Intact medial patellar retinaculum. Intact MPFL.  BONES: No aggressive osseous lesion. No fracture or dislocation.  Other: No fluid collection or hematoma. Muscles are normal.  IMPRESSION: 1. High-grade partial-thickness cartilage loss with areas of full-thickness cartilage loss of the medial femorotibial compartment and small marginal osteophytes. 2. Irregularity and partial attenuation of the posterior aspect of the posterior horn of the medial meniscus at the root concerning for a partial radial tear with peripheral meniscal extrusion.  Electronically Signed   By: Kathreen Devoid M.D.   On: 02/13/2022 08:26    Past Medical/Family/Surgical/Social History: Medications & Allergies reviewed per EMR, new medications updated. Patient Active Problem List   Diagnosis Date Noted   Abdominal pain 11/22/2020  Obesity, Class III, BMI 40-49.9 (morbid obesity) ([facility]) 09/27/2019   Anxiety and depression 09/27/2019   Psychophysiological insomnia 09/27/2019   Unsuccessful removal of intrauterine device 07/18/2017   Acute bronchitis 08/08/2012   Viral pharyngitis 06/28/2012   Redness of right eye 12/28/2011   Routine general medical examination at a health care facility 08/27/2010   Past Medical History:  Diagnosis Date   Anxiety    Depression    Family history of adverse reaction to anesthesia    MOM AND SISTER     History of chicken  pox    Family History  Problem Relation Age of Onset   Diabetes Sister    Polycystic ovary syndrome Sister    Hypertension Sister    Depression Sister    Cancer Maternal Grandfather 45       prostate, stomach   Alcohol abuse Maternal Grandfather    Stroke Paternal Grandmother    Depression Mother    Coronary artery disease Neg Hx    Past Surgical History:  Procedure Laterality Date   CESAREAN SECTION     x3 (2004, 2007, 2011)   CHOLECYSTECTOMY  2004   HYSTEROSCOPY WITH D & C N/A 07/18/2017   Procedure: DILATATION AND CURETTAGE /HYSTEROSCOPY;  Surgeon: Marcelle Overlie, MD;  Location: [facility];  Service: Gynecology;  Laterality: N/A;   INTRAUTERINE DEVICE (IUD) INSERTION N/A 07/18/2017   Procedure: INTRAUTERINE DEVICE (IUD) INSERTION, ultrasound guidance;  Surgeon: Marcelle Overlie, MD;  Location: [facility];  Service: Gynecology;  Laterality: N/A;   Social History   Occupational History   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

## 2022-04-22 NOTE — Progress Notes (Deleted)
   Procedure Note  Brittney [name] is a pleasant 47 year-old female who presents for ECSWT #6 today for left achilles tendinopathy. She has been finding benefit from these treatments. Also, remains in the orthotics for support.  Patient: Brittney [name]             Date of Birth: 02/05/1976           MRN: [id number]             Visit Date: 04/22/2022  Procedures: Visit Diagnoses:  1. Achilles tendinitis, left leg   2. Haglund's deformity, left

## 2022-04-22 NOTE — Progress Notes (Signed)
Heel is doing good; minimal pain  Primary pain today is the knee; walking with a limp due to this

## 2022-04-27 ENCOUNTER — Encounter: Payer: Self-pay | Admitting: Physical Therapy

## 2022-04-27 ENCOUNTER — Ambulatory Visit: Payer: Managed Care, Other (non HMO) | Admitting: Physical Therapy

## 2022-04-27 ENCOUNTER — Other Ambulatory Visit: Payer: Self-pay

## 2022-04-27 DIAGNOSIS — M25561 Pain in right knee: Secondary | ICD-10-CM | POA: Diagnosis not present

## 2022-04-27 DIAGNOSIS — G8929 Other chronic pain: Secondary | ICD-10-CM | POA: Diagnosis not present

## 2022-04-27 DIAGNOSIS — M6281 Muscle weakness (generalized): Secondary | ICD-10-CM

## 2022-04-27 DIAGNOSIS — M25661 Stiffness of right knee, not elsewhere classified: Secondary | ICD-10-CM | POA: Diagnosis not present

## 2022-04-27 NOTE — Therapy (Signed)
OUTPATIENT PHYSICAL THERAPY LOWER EXTREMITY EVALUATION   Patient Name: Brittney [name] MRN: [id number] DOB:22-Jul-1975, 47 y.o., female Today's Date: 04/27/2022  END OF SESSION:  PT End of Session - 04/27/22 0839     Visit Number 1    Number of Visits 6    Date for PT Re-Evaluation 06/08/22    Authorization Type [employer] $40 copay    PT Start Time 0805    PT Stop Time 0838    PT Time Calculation (min) 33 min    Activity Tolerance Patient tolerated treatment well    Behavior During Therapy [facility] for tasks assessed/performed             Past Medical History:  Diagnosis Date   Anxiety    Depression    Family history of adverse reaction to anesthesia    MOM AND SISTER     History of chicken pox    Past Surgical History:  Procedure Laterality Date   CESAREAN SECTION     x3 (2004, 2007, 2011)   CHOLECYSTECTOMY  2004   HYSTEROSCOPY WITH D & C N/A 07/18/2017   Procedure: DILATATION AND CURETTAGE /HYSTEROSCOPY;  Surgeon: Dian Queen, MD;  Location: [facility];  Service: Gynecology;  Laterality: N/A;   INTRAUTERINE DEVICE (IUD) INSERTION N/A 07/18/2017   Procedure: INTRAUTERINE DEVICE (IUD) INSERTION, ultrasound guidance;  Surgeon: Dian Queen, MD;  Location: [facility];  Service: Gynecology;  Laterality: N/A;   Patient Active Problem List   Diagnosis Date Noted   Abdominal pain 11/22/2020   Obesity, Class III, BMI 40-49.9 (morbid obesity) ([place]) 09/27/2019   Anxiety and depression 09/27/2019   Psychophysiological insomnia 09/27/2019   Unsuccessful removal of intrauterine device 07/18/2017   Acute bronchitis 08/08/2012   Viral pharyngitis 06/28/2012   Redness of right eye 12/28/2011   Routine general medical examination at a health care facility 08/27/2010    PCP: Lorrene Reid, PA-C  REFERRING PROVIDER: Elba Barman, DO  REFERRING DIAG: [phone] (ICD-10-CM) - Tear of medial meniscus of right knee, unspecified tear type, unspecified whether old or current tear,  subsequent encounter M25.561,G89.29 (ICD-10-CM) - Chronic pain of right knee  THERAPY DIAG:  Chronic pain of right knee - Plan: PT plan of care cert/re-cert  Muscle weakness (generalized) - Plan: PT plan of care cert/re-cert  Stiffness of right knee, not elsewhere classified - Plan: PT plan of care cert/re-cert  Rationale for Evaluation and Treatment: Rehabilitation  ONSET DATE: June 2023  SUBJECTIVE:   SUBJECTIVE STATEMENT: Pt reports fall about 6-7 months ago with all her weight going onto the Rt knee.  Pt reports pain has continued since, and pain migrates (now in the front of the knee).  She reports it feels like it needs to "pop" and it continues to be painful.  PERTINENT HISTORY: Anxiety, depression, obesity, Lt achilles tendonitis  PAIN:  Are you having pain? Yes: NPRS scale: 1 currently, up to 6-7, at best 1/10 Pain location: Rt knee Pain description: throbbing, "feels like there's something in there," catching, locking Aggravating factors: quick movements, unable to wear heels Relieving factors: sitting, rest, cortisone injection (1-2 weeks of relief)  PRECAUTIONS: None  WEIGHT BEARING RESTRICTIONS: No  FALLS:  Has patient fallen in last 6 months? Yes. 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 High-grade partial-thickness cartilage loss with areas of full-thickness cartilage loss of the medial femorotibial compartment and  small marginal osteophytes. 2. Irregularity and partial attenuation of the posterior aspect of the posterior horn of the medial meniscus at the root concerning for a partial radial tear with peripheral meniscal extrusion.  PATIENT SURVEYS:  04/27/22: FOTO 45 (predicted 57)  COGNITION: Overall cognitive status: Within functional limits for tasks assessed     SENSATION: WFL  EDEMA:  04/27/22: no significant swelling noted; does report tightness at end range flexion  POSTURE: No Significant postural limitations  PALPATION: 04/27/22: no tenderness noted  LOWER EXTREMITY ROM:  Active ROM Right eval Left eval  Knee flexion 122 with pain end range 132  Knee extension 9 (seated; hyperext) 10 (seated; hyperext)   (Blank rows = not tested)  LOWER EXTREMITY MMT:  MMT Right eval Left eval  Knee flexion 4/5 with pain 5/5  Knee extension 4/5 with pain 5/5   (Blank rows = not tested)  GAIT: 04/27/22 Distance walked: 100' Assistive device utilized: None Level of assistance: Complete Independence Comments: no significant deviations noted on level/indoor surfaces   TODAY'S TREATMENT:                                                                                                                              DATE: 04/27/22 See HEP - performed trial reps and for home exercises, and demonstrated gym exercises and modifications PRN    PATIENT EDUCATION:  Education details: HEP Person educated: Patient Education method: Explanation, Demonstration, and Handouts Education comprehension: verbalized understanding, returned demonstration, and needs further education  HOME EXERCISE PROGRAM: Access Code: [email or web address] URL: [email or web address] Date: 04/27/2022 Prepared by: Moshe Cipro  Exercises - Upright Bike  - 1 x daily - 7 x weekly - 3 sets - 10 reps - Single Leg Knee Extension with Weight Machine  - 1 x daily - 7 x weekly - 3 sets - 10 reps - Single Leg Hamstring  Curl with Weight Machine  - 1 x daily - 7 x weekly - 3 sets - 10 reps - Single Leg Press  - 1 x daily - 7 x weekly - 3 sets - 10 reps - Supine Bridge  - 1 x daily - 7 x weekly - 1 sets - 10 reps - 5 sec hold - Seated Straight Leg Raise  - 1 x daily - 7 x weekly - 2 sets - 10 reps - 5 sec hold  ASSESSMENT:  CLINICAL IMPRESSION: Patient is a 47 y.o. female who was seen today for physical therapy evaluation and treatment for Rt knee pain.  She demonstrates mild ROM and strength deficits with continued  pain affecting functional mobility..  She will benefit from PT to address deficits listed   OBJECTIVE IMPAIRMENTS: decreased balance, decreased mobility, decreased ROM, decreased strength, and pain.   ACTIVITY LIMITATIONS: sitting, standing, squatting, sleeping, stairs, transfers, and locomotion level  PARTICIPATION LIMITATIONS: meal prep, cleaning, laundry, driving, shopping, community activity, occupation, and yard work  PERSONAL FACTORS: 3+ comorbidities: Anxiety, depression, obesity, Lt achilles tendonitis  are also affecting patient's functional outcome.   REHAB POTENTIAL: Good  CLINICAL DECISION MAKING: Evolving/moderate complexity  EVALUATION COMPLEXITY: Moderate   GOALS: Goals reviewed with patient? Yes  SHORT TERM GOALS: Target date: 05/18/2022  Independent with initial HEP Goal status: INITIAL  LONG TERM GOALS: Target date: 06/08/2022  Independent with final HEP Goal status: INITIAL  2.  FOTO score improved to 57 Goal status: INITIAL  3.  Rt knee active flexion improved to 130 deg for improved function Goal status: INIITAL  4.  Report pain < 2/10 with standing and walking activities for improved function Goal status: INITIAL  5.  Rt knee strength improved to 5/5 with pain < 2/10 for improved function and mobility Goal status: INITIAL  6.  Negotiate stairs reciprocally without pain for improved function Goal status: INITIAL     PLAN:  PT FREQUENCY:  1x/week  PT DURATION: 6 weeks  PLANNED INTERVENTIONS: Therapeutic exercises, Therapeutic activity, Neuromuscular re-education, Balance training, Gait training, Patient/Family education, Self Care, Joint mobilization, Stair training, Aquatic Therapy, Dry Needling, Electrical stimulation, Cryotherapy, Moist heat, Taping, Vasopneumatic device, Ultrasound, Manual therapy, and Re-evaluation  PLAN FOR NEXT SESSION: TUG or DGI if needed for balance assessment, review/update HEP, manual/modalities PRN   Laureen Abrahams, PT, DPT 04/27/22 8:47 AM

## 2022-05-06 ENCOUNTER — Ambulatory Visit: Payer: Managed Care, Other (non HMO) | Admitting: Sports Medicine

## 2022-05-07 ENCOUNTER — Encounter: Payer: Managed Care, Other (non HMO) | Admitting: Physical Therapy

## 2022-05-14 ENCOUNTER — Encounter: Payer: Self-pay | Admitting: Physical Therapy

## 2022-05-14 ENCOUNTER — Ambulatory Visit: Payer: Managed Care, Other (non HMO) | Admitting: Physical Therapy

## 2022-05-14 DIAGNOSIS — M25521 Pain in right elbow: Secondary | ICD-10-CM

## 2022-05-14 DIAGNOSIS — M25561 Pain in right knee: Secondary | ICD-10-CM

## 2022-05-14 DIAGNOSIS — M6281 Muscle weakness (generalized): Secondary | ICD-10-CM

## 2022-05-14 DIAGNOSIS — M25661 Stiffness of right knee, not elsewhere classified: Secondary | ICD-10-CM | POA: Diagnosis not present

## 2022-05-14 DIAGNOSIS — G8929 Other chronic pain: Secondary | ICD-10-CM

## 2022-05-14 NOTE — Therapy (Signed)
OUTPATIENT PHYSICAL THERAPY TREATMENT NOTE   Patient Name: Brittney [name] MRN: [id number] DOB:Nov 14, 1975, 47 y.o., female Today's Date: 05/14/2022  END OF SESSION:   PT End of Session - 05/14/22 0831     Visit Number 2    Number of Visits 6    Date for PT Re-Evaluation 06/08/22    Authorization Type [employer] $40 copay    PT Start Time 0803    PT Stop Time 3716    PT Time Calculation (min) 38 min    Activity Tolerance Patient tolerated treatment well    Behavior During Therapy [facility] for tasks assessed/performed             Past Medical History:  Diagnosis Date   Anxiety    Depression    Family history of adverse reaction to anesthesia    MOM AND SISTER     History of chicken pox    Past Surgical History:  Procedure Laterality Date   CESAREAN SECTION     x3 (2004, 2007, 2011)   CHOLECYSTECTOMY  2004   HYSTEROSCOPY WITH D & C N/A 07/18/2017   Procedure: DILATATION AND CURETTAGE /HYSTEROSCOPY;  Surgeon: Dian Queen, MD;  Location: [facility];  Service: Gynecology;  Laterality: N/A;   INTRAUTERINE DEVICE (IUD) INSERTION N/A 07/18/2017   Procedure: INTRAUTERINE DEVICE (IUD) INSERTION, ultrasound guidance;  Surgeon: Dian Queen, MD;  Location: [facility];  Service: Gynecology;  Laterality: N/A;   Patient Active Problem List   Diagnosis Date Noted   Abdominal pain 11/22/2020   Obesity, Class III, BMI 40-49.9 (morbid obesity) ([place]) 09/27/2019   Anxiety and depression 09/27/2019   Psychophysiological insomnia 09/27/2019   Unsuccessful removal of intrauterine device 07/18/2017   Acute bronchitis 08/08/2012   Viral pharyngitis 06/28/2012   Redness of right eye 12/28/2011   Routine general medical examination at a health care facility 08/27/2010     THERAPY DIAG:  Chronic pain of right knee  Muscle weakness (generalized)  Stiffness of right knee, not elsewhere classified  Pain in right elbow   PCP: Lorrene Reid, PA-C  REFERRING PROVIDER: Elba Barman,  DO  REFERRING DIAG: [phone] (ICD-10-CM) - Tear of medial meniscus of right knee, unspecified tear type, unspecified whether old or current tear, subsequent encounter M25.561,G89.29 (ICD-10-CM) - Chronic pain of right knee  EVAL THERAPY DIAG:  Chronic pain of right knee - Plan: PT plan of care cert/re-cert  Muscle weakness (generalized) - Plan: PT plan of care cert/re-cert  Stiffness of right knee, not elsewhere classified - Plan: PT plan of care cert/re-cert  Rationale for Evaluation and Treatment: Rehabilitation  ONSET DATE: June 2023  SUBJECTIVE:   SUBJECTIVE STATEMENT: Overall knee is better; doesn't hurt as much.  Wore heels and then had increased pain for several days after.  Doing exercises at the gym and can't even do 5# for hamstring curls single leg.  PERTINENT HISTORY: Anxiety, depression, obesity, Lt achilles tendonitis  PAIN:  Are you having pain? Yes: NPRS scale: 1 currently, up to 6-7, at best 1/10 Pain location: Rt knee Pain description: throbbing, "feels like there's something in there," catching, locking Aggravating factors: quick movements, unable to wear heels Relieving factors: sitting, rest, cortisone injection (1-2 weeks of relief)  PRECAUTIONS: None  WEIGHT BEARING RESTRICTIONS: No  FALLS:  Has patient fallen in last 6 months? Yes. 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 High-grade partial-thickness  cartilage loss with areas of full-thickness cartilage loss of the medial femorotibial compartment and small marginal osteophytes. 2. Irregularity and partial attenuation of the posterior aspect of the posterior horn of the medial meniscus at the root concerning for a partial radial tear with peripheral meniscal extrusion.  PATIENT SURVEYS:  04/27/22: FOTO 45 (predicted 57)  COGNITION: Overall cognitive status: Within functional limits for tasks assessed     SENSATION: WFL  EDEMA:  04/27/22: no significant swelling noted; does report tightness at end range flexion  POSTURE: No Significant postural limitations  PALPATION: 04/27/22: no tenderness noted  LOWER EXTREMITY ROM:  Active ROM Right eval Left eval  Knee flexion 122 with pain end range 132  Knee extension 9 (seated; hyperext) 10 (seated; hyperext)   (Blank rows = not tested)  LOWER EXTREMITY MMT:  MMT Right eval Left eval  Knee flexion 4/5 with pain 5/5  Knee extension 4/5 with pain 5/5   (Blank rows = not tested)  GAIT: 04/27/22 Distance walked: 100' Assistive device utilized: None Level of assistance: Complete Independence Comments: no significant deviations noted on level/indoor surfaces   TODAY'S TREATMENT:                                                                                                                              DATE:  05/14/22 TherEx Knee extension 2x10; 5# RLE only Knee flexion 2x10; 10# RLE only RLE single leg deadlift 10#KB 2x10 RLE on 4" step with Lt heel taps to floor, 2x10 with light UE support TRX squats 2x10; Lt heel raise for increased RLE weight bearing  04/27/22 See HEP - performed trial reps and for home exercises, and demonstrated gym exercises and modifications PRN    PATIENT EDUCATION:  Education details: HEP Person educated: Patient Education method: Consulting civil engineer, Demonstration, and Handouts Education comprehension: verbalized understanding, returned  demonstration, and needs further education  HOME EXERCISE PROGRAM: Access Code: [email or web address] URL: [email or web address] Date: 04/27/2022 Prepared by: Faustino Congress  Exercises - Upright Bike  - 1 x daily - 7 x weekly - 3 sets - 10 reps - Single Leg Knee Extension with Weight Machine  - 1 x daily - 7 x weekly - 3 sets - 10 reps - Single Leg Hamstring Curl with Weight Machine  - 1 x daily - 7 x weekly - 3 sets - 10 reps - Single Leg Press  - 1 x daily - 7 x weekly - 3 sets - 10 reps - Supine Bridge  - 1 x daily - 7 x weekly - 1 sets - 10 reps -  5 sec hold - Seated Straight Leg Raise  - 1 x daily - 7 x weekly - 2 sets - 10 reps - 5 sec hold  ASSESSMENT:  CLINICAL IMPRESSION: Pt tolerated session well today with review of HEP and progression of strengthening exercises.  Overall pain improving at this time. Will continue to benefit from PT to maximize function.  No goals met, only 2nd visit.  OBJECTIVE IMPAIRMENTS: decreased balance, decreased mobility, decreased ROM, decreased strength, and pain.   ACTIVITY LIMITATIONS: sitting, standing, squatting, sleeping, stairs, transfers, and locomotion level  PARTICIPATION LIMITATIONS: meal prep, cleaning, laundry, driving, shopping, community activity, occupation, and yard work  PERSONAL FACTORS: 3+ comorbidities: Anxiety, depression, obesity, Lt achilles tendonitis are also affecting patient's functional outcome.   REHAB POTENTIAL: Good  CLINICAL DECISION MAKING: Evolving/moderate complexity  EVALUATION COMPLEXITY: Moderate   GOALS: Goals reviewed with patient? Yes  SHORT TERM GOALS: Target date: 05/18/2022  Independent with initial HEP Goal status: INITIAL  LONG TERM GOALS: Target date: 06/08/2022  Independent with final HEP Goal status: INITIAL  2.  FOTO score improved to 57 Goal status: INITIAL  3.  Rt knee active flexion improved to 130 deg for improved function Goal status: INIITAL  4.  Report pain < 2/10  with standing and walking activities for improved function Goal status: INITIAL  5.  Rt knee strength improved to 5/5 with pain < 2/10 for improved function and mobility Goal status: INITIAL  6.  Negotiate stairs reciprocally without pain for improved function Goal status: INITIAL     PLAN:  PT FREQUENCY: 1x/week  PT DURATION: 6 weeks  PLANNED INTERVENTIONS: Therapeutic exercises, Therapeutic activity, Neuromuscular re-education, Balance training, Gait training, Patient/Family education, Self Care, Joint mobilization, Stair training, Aquatic Therapy, Dry Needling, Electrical stimulation, Cryotherapy, Moist heat, Taping, Vasopneumatic device, Ultrasound, Manual therapy, and Re-evaluation  PLAN FOR NEXT SESSION: review/update HEP PRN, manual/modalities PRN, continue strengthening    Laureen Abrahams, PT, DPT 05/14/22 8:42 AM

## 2022-05-21 ENCOUNTER — Encounter: Payer: Managed Care, Other (non HMO) | Admitting: Physical Therapy

## 2022-05-28 ENCOUNTER — Encounter: Payer: Managed Care, Other (non HMO) | Admitting: Physical Therapy

## 2022-06-04 ENCOUNTER — Encounter: Payer: Managed Care, Other (non HMO) | Admitting: Physical Therapy

## 2022-06-13 ENCOUNTER — Other Ambulatory Visit: Payer: Self-pay | Admitting: Sports Medicine

## 2022-07-14 ENCOUNTER — Encounter: Payer: Self-pay | Admitting: Physical Therapy

## 2022-07-14 NOTE — Therapy (Signed)
PHYSICAL THERAPY DISCHARGE SUMMARY  Visits from Start of Care: 2  Current functional level related to goals / functional outcomes:  Has not returned in >30 days, DC per clinic policy    Remaining deficits: Unable to assess   Education / Equipment: Unable to assess   Patient agrees to discharge. Patient goals were not met. Patient is being discharged due to not returning since the last visit.  Deniece Ree PT DPT PN2

## 2022-09-13 ENCOUNTER — Ambulatory Visit: Payer: Managed Care, Other (non HMO) | Admitting: Sports Medicine

## 2022-09-30 LAB — HM MAMMOGRAPHY

## 2023-03-03 LAB — LIPID PANEL
Cholesterol: 245 — AB (ref 0–200)
HDL: 40 (ref 35–70)
LDL Cholesterol: 172
LDl/HDL Ratio: 4.3
Triglycerides: 177 — AB (ref 40–160)

## 2023-03-03 LAB — HEPATIC FUNCTION PANEL
ALT: 9 U/L (ref 7–35)
AST: 15 (ref 13–35)
Alkaline Phosphatase: 109 (ref 25–125)
Bilirubin, Total: 0.7

## 2023-03-03 LAB — HEMOGLOBIN A1C: Hemoglobin A1C: 6

## 2023-03-03 LAB — BASIC METABOLIC PANEL
BUN: 20 (ref 4–21)
CO2: 22 (ref 13–22)
Chloride: 105 (ref 99–108)
Creatinine: 0.7 (ref 0.5–1.1)
Glucose: 117
Potassium: 4.7 meq/L (ref 3.5–5.1)
Sodium: 141 (ref 137–147)

## 2023-03-03 LAB — IRON,TIBC AND FERRITIN PANEL: Iron: 81

## 2023-03-03 LAB — COMPREHENSIVE METABOLIC PANEL
Albumin: 3.9 (ref 3.5–5.0)
Calcium: 9.7 (ref 8.7–10.7)
Globulin: 2.7
eGFR: 105

## 2023-03-04 LAB — LAB REPORT - SCANNED
A1c: 6
EGFR: 105

## 2023-06-08 ENCOUNTER — Ambulatory Visit (INDEPENDENT_AMBULATORY_CARE_PROVIDER_SITE_OTHER): Payer: Managed Care, Other (non HMO) | Admitting: Family Medicine

## 2023-06-08 ENCOUNTER — Encounter: Payer: Self-pay | Admitting: Family Medicine

## 2023-06-08 VITALS — BP 136/86 | HR 108 | Ht 63.5 in | Wt 279.8 lb

## 2023-06-08 DIAGNOSIS — E785 Hyperlipidemia, unspecified: Secondary | ICD-10-CM | POA: Insufficient documentation

## 2023-06-08 DIAGNOSIS — M25561 Pain in right knee: Secondary | ICD-10-CM | POA: Diagnosis not present

## 2023-06-08 DIAGNOSIS — Z8249 Family history of ischemic heart disease and other diseases of the circulatory system: Secondary | ICD-10-CM

## 2023-06-08 DIAGNOSIS — Z0000 Encounter for general adult medical examination without abnormal findings: Secondary | ICD-10-CM | POA: Diagnosis not present

## 2023-06-08 DIAGNOSIS — E782 Mixed hyperlipidemia: Secondary | ICD-10-CM | POA: Diagnosis not present

## 2023-06-08 DIAGNOSIS — G8929 Other chronic pain: Secondary | ICD-10-CM | POA: Insufficient documentation

## 2023-06-08 DIAGNOSIS — Z1211 Encounter for screening for malignant neoplasm of colon: Secondary | ICD-10-CM

## 2023-06-08 DIAGNOSIS — R7303 Prediabetes: Secondary | ICD-10-CM | POA: Insufficient documentation

## 2023-06-08 DIAGNOSIS — Z1212 Encounter for screening for malignant neoplasm of rectum: Secondary | ICD-10-CM

## 2023-06-08 DIAGNOSIS — M25562 Pain in left knee: Secondary | ICD-10-CM

## 2023-06-08 MED ORDER — CELECOXIB 200 MG PO CAPS
200.0000 mg | ORAL_CAPSULE | Freq: Two times a day (BID) | ORAL | 1 refills | Status: DC | PRN
Start: 2023-06-08 — End: 2023-10-11

## 2023-06-08 NOTE — Progress Notes (Signed)
 Complete physical exam  Patient: Brittney [name]   DOB: 1975/05/14   48 y.o. Female  MRN: [id number]  Subjective:    Chief Complaint  Patient presents with   Annual Exam    Brittney [name] is a 48 y.o. female who presents today for a complete physical exam. She reports consuming a general diet.  [facility], they manage [facility].  She has concerns regarding her elevated cholesterol in addition to strong family history for CAD.  Her sister had a heart attack at age 52, so she is interested in a coronary artery calcium score.  She also has ongoing knee pain.  She states that her right knee initially started hurting after a fall about 2 years ago.  Orthopedics drained some fluid and started celecoxib but never determined an exact cause of her pain.  After stopping the celecoxib, right knee pain returned.  About 1 year ago, she developed a similar pain in her left knee.  She states that the spot on each knee that is painful varies, she describes it as a feeling like her knee "needs to pop".  She endorses feeling like her knees are weak and might give out.   Most recent fall risk assessment:    06/08/2023    1:55 PM  Fall Risk   Falls in the past year? 0  Number falls in past yr: 0  Injury with Fall? 0  Risk for fall due to : No Fall Risks  Follow up Falls evaluation completed     Most recent depression and anxiety screenings:    06/08/2023    1:55 PM 01/13/2022   10:13 AM  PHQ 2/9 Scores  PHQ - 2 Score 2 0  PHQ- 9 Score 9 3      06/08/2023    1:55 PM 01/13/2022   10:13 AM  GAD 7 : Generalized Anxiety Score  Nervous, Anxious, on Edge 1 1  Control/stop worrying 2 1  Worry too much - different things 2 1  Trouble relaxing 2 0  Restless 0 0  Easily annoyed or irritable 3 0  Afraid - awful might happen 0 0  Total GAD 7 Score 10 3  Anxiety Difficulty Not difficult at all Not difficult at all    Patient Active Problem List   Diagnosis Date  Noted   Hyperlipidemia 06/08/2023   Prediabetes 06/08/2023   Chronic pain of both knees 06/08/2023   Obesity, Class III, BMI 40-49.9 (morbid obesity) ([facility]) 09/27/2019   Anxiety and depression 09/27/2019   Psychophysiological insomnia 09/27/2019    Past Surgical History:  Procedure Laterality Date   CESAREAN SECTION     x3 (2004, 2007, 2011)   CHOLECYSTECTOMY  2004   HYSTEROSCOPY WITH D & C N/A 07/18/2017   Procedure: DILATATION AND CURETTAGE /HYSTEROSCOPY;  Surgeon: Marcelle Overlie, MD;  Location: [facility];  Service: Gynecology;  Laterality: N/A;   INTRAUTERINE DEVICE (IUD) INSERTION N/A 07/18/2017   Procedure: INTRAUTERINE DEVICE (IUD) INSERTION, ultrasound guidance;  Surgeon: Marcelle Overlie, MD;  Location: [facility];  Service: Gynecology;  Laterality: N/A;   Social History   Tobacco Use   Smoking status: Never   Smokeless tobacco: Never  Vaping Use   Vaping status: Never Used  Substance Use Topics   Alcohol use: Yes    Comment: Rare   Drug use: No   Family History  Problem Relation Age of Onset   Depression Mother    Diabetes Sister  Polycystic ovary syndrome Sister    Hypertension Sister    Depression Sister    Heart attack Sister 60   Cancer Maternal Grandfather 62       prostate, stomach   Alcohol abuse Maternal Grandfather    Stroke Paternal Grandmother    Coronary artery disease Neg Hx    No Known Allergies   Patient Care Team: Sandre Kitty, MD as PCP - General (Family Medicine) Marcelle Overlie, MD as Consulting Physician (Obstetrics and Gynecology)   Outpatient Medications Prior to Visit  Medication Sig   ALPRAZolam Prudy Feeler) 1 MG tablet Take 1 mg by mouth at bedtime.   amphetamine-dextroamphetamine (ADDERALL XR) 25 MG 24 hr capsule Take 25 mg by mouth every morning.   BIOTIN PO Take 1 tablet by mouth daily.   buPROPion (WELLBUTRIN XL) 300 MG 24 hr tablet Take 300 mg by mouth daily.   levonorgestrel (MIRENA, 52 MG,) 20 MCG/DAY IUD 1 each by Intrauterine  route once.   [DISCONTINUED] amphetamine-dextroamphetamine (ADDERALL) 10 MG tablet Take 10 mg by mouth daily.   [DISCONTINUED] buPROPion (WELLBUTRIN XL) 150 MG 24 hr tablet Take 2 tablets by mouth daily.   [DISCONTINUED] Loratadine (CLARITIN PO) Take 10 mg by mouth daily. (Patient not taking: Reported on 06/08/2023)   [DISCONTINUED] nitroGLYCERIN (NITRODUR - DOSED IN MG/24 HR) 0.2 mg/hr patch PLACE 1 PATCH (0.2 MG TOTAL) ONTO THE SKIN DAILY. PLACE NEAR THE SPOT OF MAXIMAL TENDERNESS OVER THE RIGHT ELBOW. ALTERNATE THE SPOT EVERY DAY SO NOT PUTTING THE PATCH IN THE SAME SPOT AS YESTERDAY. DISCONTINUE USE IF YOU NOTICE CHEST DISCOMFORT OR PALPITATIONS.   No facility-administered medications prior to visit.    Review of Systems  Constitutional:  Negative for chills, fever and malaise/fatigue.  HENT:  Negative for congestion and hearing loss.   Eyes:  Negative for blurred vision and double vision.  Respiratory:  Negative for cough and shortness of breath.   Cardiovascular:  Negative for chest pain, palpitations and leg swelling.  Gastrointestinal:  Negative for abdominal pain, constipation, diarrhea and heartburn.  Genitourinary:  Negative for frequency and urgency.  Musculoskeletal:  Positive for joint pain. Negative for myalgias and neck pain.  Neurological:  Negative for headaches.  Endo/Heme/Allergies:  Negative for polydipsia.  Psychiatric/Behavioral:  Negative for depression. The patient is not nervous/anxious.       Objective:    BP 136/86   Pulse (!) 108   Ht 5' 3.5" (1.613 m)   Wt 279 lb 12 oz (126.9 kg)   SpO2 96%   BMI 48.78 kg/m    Physical Exam Constitutional:      General: She is not in acute distress.    Appearance: Normal appearance.  HENT:     Head: Normocephalic and atraumatic.     Right Ear: Tympanic membrane, ear canal and external ear normal.     Left Ear: Tympanic membrane, ear canal and external ear normal.     Nose: Nose normal.     Mouth/Throat:      Mouth: Mucous membranes are moist.     Pharynx: No oropharyngeal exudate or posterior oropharyngeal erythema.  Eyes:     Extraocular Movements: Extraocular movements intact.     Conjunctiva/sclera: Conjunctivae normal.     Pupils: Pupils are equal, round, and reactive to light.  Neck:     Thyroid: No thyroid mass, thyromegaly or thyroid tenderness.  Cardiovascular:     Rate and Rhythm: Normal rate and regular rhythm.     Heart sounds: Normal  heart sounds. No murmur heard.    No friction rub. No gallop.  Pulmonary:     Effort: Pulmonary effort is normal. No respiratory distress.     Breath sounds: Normal breath sounds. No wheezing, rhonchi or rales.  Abdominal:     General: Abdomen is flat. Bowel sounds are normal. There is no distension.     Palpations: There is no mass.     Tenderness: There is no abdominal tenderness. There is no guarding.  Musculoskeletal:        General: No swelling, tenderness or deformity. Normal range of motion.     Cervical back: Normal range of motion and neck supple.  Lymphadenopathy:     Cervical: No cervical adenopathy.  Skin:    General: Skin is warm and dry.  Neurological:     Mental Status: She is alert and oriented to person, place, and time.     Cranial Nerves: No cranial nerve deficit.     Motor: No weakness.     Deep Tendon Reflexes: Reflexes normal.  Psychiatric:        Mood and Affect: Mood normal.        Assessment & Plan:    Routine Health Maintenance and Physical Exam  Immunization History  Administered Date(s) Administered   Tdap 12/13/2019    Health Maintenance  Topic Date Due   HIV Screening  Never done   Hepatitis C Screening  Never done   Colonoscopy  Never done   COVID-19 Vaccine (1 - 2024-25 season) 06/24/2023 (Originally 12/12/2022)   INFLUENZA VACCINE  07/11/2023 (Originally 11/11/2022)   Cervical Cancer Screening (HPV/Pap Cotest)  09/02/2025   DTaP/Tdap/Td (2 - Td or Tdap) 12/12/2029   HPV VACCINES  Aged Out     Ordering labs including CBC, TSH, and vitamin D. CMP, lipid panel, A1C completed by her employer. Established at [facility], requesting copies of pap 09/02/2020 and mammo 09/30/2022. Agreeable to colonoscopy for colorectal cancer screening.  Discussed health benefits of physical activity, and encouraged her to engage in regular exercise appropriate for her age and condition.  Wellness examination  Chronic pain of both knees Assessment & Plan: Starting workup with updated x-rays to differentiate between osteoarthritis, bony abnormalities, and possible soft tissue abnormalities.  Also sending refill of celecoxib for symptomatic relief while awaiting x-ray results.  Orders: -     DG Knee 1-2 Views Left -     DG Knee 1-2 Views Right -     Celecoxib; Take 1 capsule (200 mg total) by mouth 2 (two) times daily as needed for moderate pain (pain score 4-6) (knee pain).  Dispense: 180 capsule; Refill: 1  Mixed hyperlipidemia Assessment & Plan: Last lipid panel: LDL 172, triglycerides 177, total cholesterol 245.  The 10-year ASCVD risk score (Arnett DK, et al., 2019) is: 2.1%* (Cholesterol units were assumed). Ordering coronary artery calcium score given hyperlipidemia and family history of early CAD.  Depending on results, may recommend starting moderate or high intensity statin to manage cholesterol levels and mitigate risk.  Orders: -     CT CARDIAC SCORING (SELF PAY ONLY); Future  Prediabetes -     CT CARDIAC SCORING (SELF PAY ONLY); Future  Family history of early CAD -     CT CARDIAC SCORING (SELF PAY ONLY); Future  Screening for colorectal cancer -     Ambulatory referral to Gastroenterology    Return in about 1 day (around 06/09/2023) for fasting blood work; 2 months for knee pain, CAC  score and hyperlipidemia review.     Melida Quitter, PA

## 2023-06-08 NOTE — Assessment & Plan Note (Signed)
 Starting workup with updated x-rays to differentiate between osteoarthritis, bony abnormalities, and possible soft tissue abnormalities.  Also sending refill of celecoxib for symptomatic relief while awaiting x-ray results.

## 2023-06-08 NOTE — Assessment & Plan Note (Addendum)
 Last lipid panel: LDL 172, triglycerides 177, total cholesterol 245.  The 10-year ASCVD risk score (Arnett DK, et al., 2019) is: 2.1%* (Cholesterol units were assumed). Ordering coronary artery calcium score given hyperlipidemia and family history of early CAD.  Depending on results, may recommend starting moderate or high intensity statin to manage cholesterol levels and mitigate risk.

## 2023-06-08 NOTE — Patient Instructions (Addendum)
 KNEE PAIN: -We will start with xrays. The orders have been placed, you can go as a walk in at [facility]. [facility] [address] [place], [place] [address] Phone [phone]  -In the meantime, I sent a refill of the anti-inflammatory medicine you previously took (celecoxib) to provide some relief while we are figuring out the cause of your knee pain!  CORONARY ARTERY CALCIUM SCORE: -I have placed the order, you should receive a call to schedule.  COLON CANCER SCREENING: -I have placed the referral and you should receive a call to schedule the colonoscopy.

## 2023-06-09 ENCOUNTER — Other Ambulatory Visit: Payer: Managed Care, Other (non HMO)

## 2023-06-14 ENCOUNTER — Other Ambulatory Visit (HOSPITAL_COMMUNITY): Payer: Managed Care, Other (non HMO)

## 2023-06-16 ENCOUNTER — Ambulatory Visit
Admission: RE | Admit: 2023-06-16 | Discharge: 2023-06-16 | Disposition: A | Discharge status: Home / Self Care | Source: Ambulatory Visit | Attending: Family Medicine | Admitting: Family Medicine

## 2023-06-16 ENCOUNTER — Ambulatory Visit (HOSPITAL_COMMUNITY)
Admission: RE | Admit: 2023-06-16 | Discharge: 2023-06-16 | Disposition: A | Discharge status: Home / Self Care | Payer: Self-pay | Source: Ambulatory Visit | Attending: Family Medicine | Admitting: Family Medicine

## 2023-06-16 DIAGNOSIS — Z8249 Family history of ischemic heart disease and other diseases of the circulatory system: Secondary | ICD-10-CM | POA: Insufficient documentation

## 2023-06-16 DIAGNOSIS — E782 Mixed hyperlipidemia: Secondary | ICD-10-CM | POA: Insufficient documentation

## 2023-06-16 DIAGNOSIS — R7303 Prediabetes: Secondary | ICD-10-CM | POA: Insufficient documentation

## 2023-06-17 ENCOUNTER — Encounter: Payer: Self-pay | Admitting: Family Medicine

## 2023-06-20 ENCOUNTER — Encounter: Payer: Self-pay | Admitting: Family Medicine

## 2023-06-30 ENCOUNTER — Encounter: Payer: Self-pay | Admitting: Gastroenterology

## 2023-07-28 ENCOUNTER — Ambulatory Visit (AMBULATORY_SURGERY_CENTER)

## 2023-07-28 VITALS — Ht 63.5 in | Wt 275.0 lb

## 2023-07-28 DIAGNOSIS — Z1211 Encounter for screening for malignant neoplasm of colon: Secondary | ICD-10-CM

## 2023-07-28 MED ORDER — NA SULFATE-K SULFATE-MG SULF 17.5-3.13-1.6 GM/177ML PO SOLN: 1.0000 | Freq: Once | ORAL | 0 refills | Status: DC

## 2023-07-28 NOTE — Progress Notes (Signed)

## 2023-08-05 ENCOUNTER — Encounter: Payer: Self-pay | Admitting: Gastroenterology

## 2023-08-05 ENCOUNTER — Telehealth: Payer: Self-pay | Admitting: Gastroenterology

## 2023-08-05 DIAGNOSIS — Z1211 Encounter for screening for malignant neoplasm of colon: Secondary | ICD-10-CM

## 2023-08-05 MED ORDER — NA SULFATE-K SULFATE-MG SULF 17.5-3.13-1.6 GM/177ML PO SOLN: 1.0000 | Freq: Once | ORAL | 0 refills | Status: AC

## 2023-08-05 NOTE — Telephone Encounter (Signed)
 Prep of of stock [facility] [address] Canceled and sent new rx to [place] location per patient request.

## 2023-08-05 NOTE — Telephone Encounter (Signed)
 PT is calling because her pharmacy does not have Suprep in stock and she needs an alternative. Please advise.

## 2023-08-12 ENCOUNTER — Ambulatory Visit (AMBULATORY_SURGERY_CENTER): Admitting: Gastroenterology

## 2023-08-12 ENCOUNTER — Encounter: Payer: Self-pay | Admitting: Gastroenterology

## 2023-08-12 VITALS — BP 128/82 | HR 88 | Temp 98.8°F | Resp 12 | Ht 63.5 in | Wt 275.0 lb

## 2023-08-12 DIAGNOSIS — D124 Benign neoplasm of descending colon: Secondary | ICD-10-CM | POA: Diagnosis not present

## 2023-08-12 DIAGNOSIS — D123 Benign neoplasm of transverse colon: Secondary | ICD-10-CM

## 2023-08-12 DIAGNOSIS — K5730 Diverticulosis of large intestine without perforation or abscess without bleeding: Secondary | ICD-10-CM

## 2023-08-12 DIAGNOSIS — Z1211 Encounter for screening for malignant neoplasm of colon: Secondary | ICD-10-CM | POA: Diagnosis present

## 2023-08-12 MED ORDER — SODIUM CHLORIDE 0.9 % IV SOLN: 500.0000 mL | Freq: Once | INTRAVENOUS | Status: DC

## 2023-08-12 NOTE — Progress Notes (Signed)
 [place], gd SR, tolerated procedure well, VSS, report to RN

## 2023-08-12 NOTE — Op Note (Signed)
 [facility] Patient Name: Brittney [name] Procedure Date: 08/12/2023 8:30 AM MRN: [id number] Endoscopist: Geralyn Knee E. Cherryl Corona , MD, [id number] Age: 48 Referring MD:  Date of Birth: April 30, 1975 Gender: Female Account #: [id number] Procedure:                Colonoscopy Indications:              Screening for colorectal malignant neoplasm, This                            is the patient's first colonoscopy Medicines:                Monitored Anesthesia Care Procedure:                Pre-Anesthesia Assessment:                           - Prior to the procedure, a History and Physical                            was performed, and patient medications and                            allergies were reviewed. The patient's tolerance of                            previous anesthesia was also reviewed. The risks                            and benefits of the procedure and the sedation                            options and risks were discussed with the patient.                            All questions were answered, and informed consent                            was obtained. Prior Anticoagulants: The patient has                            taken no anticoagulant or antiplatelet agents. ASA                            Grade Assessment: III - A patient with severe                            systemic disease. After reviewing the risks and                            benefits, the patient was deemed in satisfactory                            condition to undergo the procedure.  After obtaining informed consent, the colonoscope                            was passed under direct vision. Throughout the                            procedure, the patient's blood pressure, pulse, and                            oxygen saturations were monitored continuously. The                            Olympus Scope SN: [id number] was introduced through                            the anus and  advanced to the the cecum, identified                            by appendiceal orifice and ileocecal valve. The                            colonoscopy was performed without difficulty. The                            patient tolerated the procedure well. The quality                            of the bowel preparation was good. The ileocecal                            valve, appendiceal orifice, and rectum were                            photographed. The bowel preparation used was SUPREP                            via split dose instruction. Scope In: 8:42:42 AM Scope Out: 8:55:06 AM Scope Withdrawal Time: 0 hours 8 minutes 31 seconds  Total Procedure Duration: 0 hours 12 minutes 24 seconds  Findings:                 The perianal and digital rectal examinations were                            normal. Pertinent negatives include normal                            sphincter tone and no palpable rectal lesions.                           A 4 mm polyp was found in the descending colon. The                            polyp was sessile. The polyp was  removed with a                            cold snare. Resection and retrieval were complete.                            Estimated blood loss was minimal.                           A single small-mouthed diverticulum was found in                            the transverse colon.                           A 3 mm polyp was found in the transverse colon. The                            polyp was sessile. The polyp was removed with a                            cold snare. Resection and retrieval were complete.                            Estimated blood loss was minimal.                           A 3 mm polyp was found in the splenic flexure. The                            polyp was sessile. The polyp was removed with a                            cold snare. Resection and retrieval were complete.                            Estimated blood loss was minimal.                            The exam was otherwise normal throughout the                            examined colon.                           The retroflexed view of the distal rectum and anal                            verge was normal and showed no anal or rectal                            abnormalities. Complications:            No immediate complications. Estimated Blood Loss:     Estimated blood loss was minimal. Impression:               -  One 4 mm polyp in the descending colon, removed                            with a cold snare. Resected and retrieved.                           - Single small diverticulum in the transverse colon.                           - One 3 mm polyp in the transverse colon, removed                            with a cold snare. Resected and retrieved.                           - One 3 mm polyp at the splenic flexure, removed                            with a cold snare. Resected and retrieved.                           - The distal rectum and anal verge are normal on                            retroflexion view. Recommendation:           - Patient has a contact number available for                            emergencies. The signs and symptoms of potential                            delayed complications were discussed with the                            patient. Return to normal activities tomorrow.                            Written discharge instructions were provided to the                            patient.                           - Resume previous diet.                           - Continue present medications.                           - Await pathology results.                           - Repeat colonoscopy (date not yet determined) for  surveillance based on pathology results. Jonhatan Hearty E. Cherryl Corona, MD 08/12/2023 9:00:20 AM This report has been signed electronically.

## 2023-08-12 NOTE — Progress Notes (Signed)
 Pt's states no medical or surgical changes since previsit or office visit.

## 2023-08-12 NOTE — Progress Notes (Signed)
 Called to room to assist during endoscopic procedure.  Patient ID and intended procedure confirmed with present staff. Received instructions for my participation in the procedure from the performing physician.

## 2023-08-12 NOTE — Patient Instructions (Signed)

## 2023-08-12 NOTE — Progress Notes (Signed)
 [facility] History and Physical   Primary Care Physician:  Laneta Pintos, MD   Reason for Procedure:   Colon cancer screening  Plan:    Screening colonoscopy     HPI: Brittney [name] is a 48 y.o. female undergoing initial average risk screening colonoscopy.  She has no family history of colon cancer and no chronic GI symptoms.    Past Medical History:  Diagnosis Date   Anxiety    Depression    Family history of adverse reaction to anesthesia    MOM AND SISTER     History of chicken pox     Past Surgical History:  Procedure Laterality Date   CESAREAN SECTION     x3 (2004, 2007, 2011)   CHOLECYSTECTOMY  2004   HYSTEROSCOPY WITH D & C N/A 07/18/2017   Procedure: DILATATION AND CURETTAGE /HYSTEROSCOPY;  Surgeon: Thurman Flores, MD;  Location: [facility];  Service: Gynecology;  Laterality: N/A;   INTRAUTERINE DEVICE (IUD) INSERTION N/A 07/18/2017   Procedure: INTRAUTERINE DEVICE (IUD) INSERTION, ultrasound guidance;  Surgeon: Thurman Flores, MD;  Location: [facility];  Service: Gynecology;  Laterality: N/A;    Prior to Admission medications   Medication Sig Start Date End Date Taking? Authorizing Provider  ALPRAZolam (XANAX) 1 MG tablet Take 1 mg by mouth at bedtime.   Yes [provider]  amphetamine-dextroamphetamine (ADDERALL XR) 25 MG 24 hr capsule Take 25 mg by mouth every morning. 01/25/22  Yes [provider]  buPROPion (WELLBUTRIN XL) 300 MG 24 hr tablet Take 300 mg by mouth daily.   Yes [provider]  Cholecalciferol (VITAMIN D-3 PO) Take 1 tablet by mouth daily.   Yes [provider]  levonorgestrel (MIRENA, 52 MG,) 20 MCG/DAY IUD 1 each by Intrauterine route once. 06/26/20  Yes [provider]  loratadine (CLARITIN) 10 MG tablet Take 10 mg by mouth daily.   Yes [provider]  magnesium oxide (MAG-OX) 400 (240 Mg) MG tablet Take 400 mg by mouth daily.   Yes [provider]  BIOTIN PO Take 1  tablet by mouth daily. Patient not taking: Reported on 08/12/2023    [provider]  celecoxib  (CELEBREX ) 200 MG capsule Take 1 capsule (200 mg total) by mouth 2 (two) times daily as needed for moderate pain (pain score 4-6) (knee pain). 06/08/23   Noreene Bearded, PA  doxycycline (VIBRAMYCIN) 100 MG capsule Take 100 mg by mouth 2 (two) times daily.    [provider]    Current Outpatient Medications  Medication Sig Dispense Refill   ALPRAZolam (XANAX) 1 MG tablet Take 1 mg by mouth at bedtime.     amphetamine-dextroamphetamine (ADDERALL XR) 25 MG 24 hr capsule Take 25 mg by mouth every morning.     buPROPion (WELLBUTRIN XL) 300 MG 24 hr tablet Take 300 mg by mouth daily.     Cholecalciferol (VITAMIN D-3 PO) Take 1 tablet by mouth daily.     levonorgestrel (MIRENA, 52 MG,) 20 MCG/DAY IUD 1 each by Intrauterine route once.     loratadine (CLARITIN) 10 MG tablet Take 10 mg by mouth daily.     magnesium oxide (MAG-OX) 400 (240 Mg) MG tablet Take 400 mg by mouth daily.     BIOTIN PO Take 1 tablet by mouth daily. (Patient not taking: Reported on 08/12/2023)     celecoxib  (CELEBREX ) 200 MG capsule Take 1 capsule (200 mg total) by mouth 2 (two) times daily as needed for moderate pain (  pain score 4-6) (knee pain). 180 capsule 1   doxycycline (VIBRAMYCIN) 100 MG capsule Take 100 mg by mouth 2 (two) times daily.     Current Facility-Administered Medications  Medication Dose Route Frequency Provider Last Rate Last Admin   0.9 %  sodium chloride  infusion  500 mL Intravenous Once Elois Hair, MD        Allergies as of 08/12/2023   (No Known Allergies)    Family History  Problem Relation Age of Onset   Depression Mother    Diabetes Sister    Polycystic ovary syndrome Sister    Hypertension Sister    Depression Sister    Heart attack Sister 43   Stomach cancer Maternal Grandfather    Cancer Maternal Grandfather 75       prostate, stomach   Alcohol abuse Maternal  Grandfather    Stroke Paternal Grandmother    Coronary artery disease Neg Hx    Colon cancer Neg Hx    Rectal cancer Neg Hx     Social History   Socioeconomic History   Marital status: Married    Spouse name: Not on file   Number of children: 3   Years of education: some coll   Highest education level: Not on file  Occupational History   Occupation: accounts IT consultant: Chief Strategy Officer    Comment: Chief Strategy Officer  Tobacco Use   Smoking status: Never   Smokeless tobacco: Never  Vaping Use   Vaping status: Never Used  Substance and Sexual Activity   Alcohol use: Yes    Comment: Rare   Drug use: No   Sexual activity: Yes    Birth control/protection: I.U.D.  Other Topics Concern   Not on file  Social History Narrative   Caffeine: 2 cups diet sodas   Lives with husband, 3 children, 1 dog         Social Drivers of Corporate investment banker Strain: Not on file  Food Insecurity: Not on file  Transportation Needs: Not on file  Physical Activity: Not on file  Stress: Not on file  Social Connections: Not on file  Intimate Partner Violence: Not on file    Review of Systems:  All other review of systems negative except as mentioned in the HPI.  Physical Exam: Vital signs BP (!) 145/78   Pulse 97   Temp 98.8 F (37.1 C) (Skin)   Ht 5' 3.5" (1.613 m)   Wt 275 lb (124.7 kg)   SpO2 95%   BMI 47.95 kg/m   General:   Alert,  Well-developed, well-nourished, pleasant and cooperative in NAD Airway:  Mallampati 3 Lungs:  Clear throughout to auscultation.   Heart:  Regular rate and rhythm; no murmurs, clicks, rubs,  or gallops. Abdomen:  Soft, nontender and nondistended. Normal bowel sounds.   Neuro/Psych:  Normal mood and affect. A and O x 3   Anguel Delapena E. Cherryl Corona, MD [facility]

## 2023-08-15 ENCOUNTER — Telehealth: Payer: Self-pay

## 2023-08-15 NOTE — Telephone Encounter (Signed)
  Follow up Call-     08/12/2023    8:03 AM  Call back number  Post procedure Call Back phone  # [phone]  Permission to leave phone message Yes     Patient questions:  Do you have a fever, pain , or abdominal swelling? No. Pain Score  0 *  Have you tolerated food without any problems? Yes.    Have you been able to return to your normal activities? Yes.    Do you have any questions about your discharge instructions: Diet   No. Medications  No. Follow up visit  No.  Do you have questions or concerns about your Care? No.  Actions: * If pain score is 4 or above: No action needed, pain <4.

## 2023-08-16 LAB — SURGICAL PATHOLOGY

## 2023-08-20 ENCOUNTER — Encounter: Payer: Self-pay | Admitting: Gastroenterology

## 2023-08-20 NOTE — Progress Notes (Signed)
 Brittney [name],  Two polyps which I removed during your recent procedure were proven to be completely benign but are considered "pre-cancerous" polyps that MAY have grown into cancer if they had not been removed.  One polyp was not precancerous.  Studies shows that at least 20% of women over age 46 and 30% of men over age 34 have pre-cancerous polyps.  Based on current nationally recognized surveillance guidelines, I recommend that you have a repeat colonoscopy in 7 years.   If you develop any new rectal bleeding, abdominal pain or significant bowel habit changes, please contact me before then.

## 2023-09-14 ENCOUNTER — Encounter: Payer: Self-pay | Admitting: Family Medicine

## 2023-09-14 ENCOUNTER — Ambulatory Visit: Payer: Managed Care, Other (non HMO) | Admitting: Family Medicine

## 2023-09-14 VITALS — BP 138/88 | HR 101 | Ht 63.5 in | Wt 285.8 lb

## 2023-09-14 DIAGNOSIS — G4762 Sleep related leg cramps: Secondary | ICD-10-CM | POA: Diagnosis not present

## 2023-09-14 DIAGNOSIS — R202 Paresthesia of skin: Secondary | ICD-10-CM

## 2023-09-14 DIAGNOSIS — M25562 Pain in left knee: Secondary | ICD-10-CM | POA: Diagnosis not present

## 2023-09-14 DIAGNOSIS — M25561 Pain in right knee: Secondary | ICD-10-CM | POA: Diagnosis not present

## 2023-09-14 DIAGNOSIS — G8929 Other chronic pain: Secondary | ICD-10-CM

## 2023-09-14 MED ORDER — GABAPENTIN 100 MG PO CAPS: 200.0000 mg | ORAL_CAPSULE | Freq: Every day | ORAL | 1 refills | Status: AC

## 2023-09-14 NOTE — Patient Instructions (Addendum)
 It was nice to see you today,  We addressed the following topics today: -I have sent in a prescription for gabapentin 200 mg.  It is written and 100 mg tablets. - I would like you to take 100 mg at night before bed for your muscle cramping.  You can titrate up to 200 mg if 100 does not work. - This medication can make you drowsy so it is best to take it at night and do not take more than the recommended dose. - I would reach back out to your orthopedist regarding your knee.  It looks like they wanted to follow-up with you at some point after their last visit.  Have a great day,  Etha Henle, MD

## 2023-09-14 NOTE — Progress Notes (Unsigned)
   Established Patient Office Visit  Subjective   Patient ID: Brittney [name], female    DOB: 08/21/1975  Age: 48 y.o. MRN: [id number]  No chief complaint on file.   HPI  Subjective - Knee pain bilaterally for several years, started after falling on right knee - Reports constant aching, inability to bend knees, worse when taking magnesium and B vitamins - Stopped taking magnesium prior to colonoscopy last month, notes knee pain improved but leg cramps returned - Experiences severe nocturnal leg cramps ("Charlie horses") multiple times nightly - Reports numbness/tingling in right hand and right foot when lying down at night - Notes numbness sometimes extends up leg - Reports poor sleep, takes Xanax 1mg  nightly to help with racing thoughts  Medications: Celebrex  daily for knee pain, Xanax 1mg  at bedtime for sleep.  PMH: Sciatica, back pain (recently saw chiropractor for back pain).  PSH: MRI knee 04/2022 showed arthritis and meniscal injury, colonoscopy last month.  Social Hx: 48 years old.  ROS: Positive for nocturnal leg cramps, paresthesias in right hand and foot, insomnia. Negative for daytime cramping.    The 10-year ASCVD risk score (Arnett DK, et al., 2019) is: 2.2%* (Cholesterol units were assumed)  Health Maintenance Due  Topic Date Due   HIV Screening  Never done   Hepatitis C Screening  Never done   COVID-19 Vaccine (1 - 2024-25 season) Never done      Objective:     BP 138/88   Pulse (!) 101   Ht 5' 3.5" (1.613 m)   Wt 285 lb 12 oz (129.6 kg)   SpO2 95%   BMI 49.82 kg/m  {Vitals History (Optional):23777}  Physical Exam   No results found for any visits on 09/14/23.      Assessment & Plan:   There are no diagnoses linked to this encounter.   Return in about 4 weeks (around 10/12/2023) for muscle cramps.    Laneta Pintos, MD

## 2023-09-20 DIAGNOSIS — R202 Paresthesia of skin: Secondary | ICD-10-CM | POA: Insufficient documentation

## 2023-09-20 DIAGNOSIS — G4762 Sleep related leg cramps: Secondary | ICD-10-CM | POA: Insufficient documentation

## 2023-09-20 NOTE — Assessment & Plan Note (Addendum)
-   History of fall with injury to right knee several years ago - Previous MRI (04/2022) showed arthritis and meniscal injury - Previously tried physical therapy without improvement - Previously received knee injection - Currently taking Celebrex  with some relief - Recent x-rays reportedly normal - Stopped taking magnesium and B vitamins with improvement in knee pain - Will refer back to orthopedics for further evaluation and management options. Their last note states they wanted pt to follow up in a few months, but pt was unaware of this.

## 2023-09-20 NOTE — Assessment & Plan Note (Signed)
-   Severe cramping occurring multiple times nightly - Previously improved with magnesium supplementation - Starting gabapentin  100mg  at bedtime, may increase to 200mg  after 1-2 weeks if needed - Maximum dose 300mg  without further consultation

## 2023-09-20 NOTE — Assessment & Plan Note (Addendum)
 RUE/RLE - Occurs when lying down at night - Intermittent pattern with periods of flare-ups - Will check B12, folate, magnesium, and thyroid levels - Consider nerve conduction studies if symptoms persist despite gabapentin  - May refer to Physical Medicine and Rehabilitation for nerve conduction studies  if needed

## 2023-09-21 ENCOUNTER — Other Ambulatory Visit: Payer: Self-pay | Admitting: *Deleted

## 2023-09-21 DIAGNOSIS — G629 Polyneuropathy, unspecified: Secondary | ICD-10-CM

## 2023-09-23 ENCOUNTER — Other Ambulatory Visit

## 2023-09-23 DIAGNOSIS — G629 Polyneuropathy, unspecified: Secondary | ICD-10-CM

## 2023-09-24 LAB — B12 AND FOLATE PANEL
Folate: 9.3 ng/mL (ref >3.0)
Vitamin B-12: 456 pg/mL (ref 232–1245)

## 2023-09-24 LAB — TSH: TSH: 2.12 u[IU]/mL (ref 0.450–4.500)

## 2023-09-24 LAB — MAGNESIUM: Magnesium: 1.8 mg/dL (ref 1.6–2.3)

## 2023-09-26 ENCOUNTER — Ambulatory Visit: Payer: Self-pay | Admitting: Family Medicine

## 2023-10-11 ENCOUNTER — Other Ambulatory Visit: Payer: Self-pay | Admitting: Family Medicine

## 2023-10-11 DIAGNOSIS — G8929 Other chronic pain: Secondary | ICD-10-CM

## 2023-10-13 ENCOUNTER — Ambulatory Visit: Admitting: Family Medicine

## 2023-11-02 ENCOUNTER — Other Ambulatory Visit: Payer: Self-pay

## 2023-11-02 ENCOUNTER — Ambulatory Visit: Attending: Obstetrics | Admitting: Physical Therapy

## 2023-11-02 DIAGNOSIS — N393 Stress incontinence (female) (male): Secondary | ICD-10-CM | POA: Diagnosis present

## 2023-11-02 DIAGNOSIS — R293 Abnormal posture: Secondary | ICD-10-CM | POA: Diagnosis present

## 2023-11-02 DIAGNOSIS — R279 Unspecified lack of coordination: Secondary | ICD-10-CM | POA: Insufficient documentation

## 2023-11-02 DIAGNOSIS — M62838 Other muscle spasm: Secondary | ICD-10-CM | POA: Diagnosis present

## 2023-11-02 DIAGNOSIS — M6281 Muscle weakness (generalized): Secondary | ICD-10-CM | POA: Insufficient documentation

## 2023-11-02 NOTE — Therapy (Signed)
 OUTPATIENT PHYSICAL THERAPY FEMALE PELVIC EVALUATION   Patient Name: Brittney [name] MRN: [id number] DOB:07/27/1975, 48 y.o., female Today's Date: 11/02/2023  END OF SESSION:  PT End of Session - 11/02/23 0926     Visit Number 1    Date for PT Re-Evaluation 05/04/24    Authorization Type cigna    PT Start Time [phone]    PT Stop Time 0928    PT Time Calculation (min) 42 min    Activity Tolerance Patient tolerated treatment well    Behavior During Therapy [facility] for tasks assessed/performed          Past Medical History:  Diagnosis Date   Anxiety    Depression    Family history of adverse reaction to anesthesia    MOM AND SISTER     History of chicken pox    Past Surgical History:  Procedure Laterality Date   CESAREAN SECTION     x3 (2004, 2007, 2011)   CHOLECYSTECTOMY  2004   HYSTEROSCOPY WITH D & C N/A 07/18/2017   Procedure: DILATATION AND CURETTAGE /HYSTEROSCOPY;  Surgeon: Mat Browning, MD;  Location: [facility];  Service: Gynecology;  Laterality: N/A;   INTRAUTERINE DEVICE (IUD) INSERTION N/A 07/18/2017   Procedure: INTRAUTERINE DEVICE (IUD) INSERTION, ultrasound guidance;  Surgeon: Mat Browning, MD;  Location: [facility];  Service: Gynecology;  Laterality: N/A;   Patient Active Problem List   Diagnosis Date Noted   Nocturnal leg cramps 09/20/2023   Paresthesia 09/20/2023   Hyperlipidemia 06/08/2023   Prediabetes 06/08/2023   Chronic pain of both knees 06/08/2023   Obesity, Class III, BMI 40-49.9 (morbid obesity) 09/27/2019   Anxiety and depression 09/27/2019   Psychophysiological insomnia 09/27/2019    PCP: Chandra Toribio POUR, MD   REFERRING PROVIDER: Kandyce Sor, MD  REFERRING DIAG: N39.3 (ICD-10-CM) - Stress incontinence (female) (female)  THERAPY DIAG:  Muscle weakness (generalized)  Stress incontinence  Other muscle spasm  Abnormal posture  Unspecified lack of coordination  Rationale for Evaluation and Treatment: Rehabilitation  ONSET DATE:  chronic   SUBJECTIVE:                                                                                                                                                                                           SUBJECTIVE STATEMENT: Went to gyn and had v-tone done three sessions, reports she was told she was unable to tighten pelvic floor. And pt states she has urge to urinate all of the time. Limits fluids due to this. Does have urinary incontinence with stressors and urge. Does feel like urinary symptoms are worse since v-tone.   Fluid intake: water -  32oz (at least sometimes a little more), tea sometimes with dinner but nothing often  PAIN:  PAIN:  Are you having pain? Yes NPRS scale: 0-4/10 Pain location: mid back PAIN TYPE: aching Pain description: intermittent  Aggravating factors: random, feels like she can't hold up right Relieving factors: changing positions    PRECAUTIONS: None  RED FLAGS: None   WEIGHT BEARING RESTRICTIONS: No  FALLS:  Has patient fallen in last 6 months? No  OCCUPATION: Gaffer   ACTIVITY LEVEL : low   PLOF: Independent  PATIENT GOALS: to have less leakage  PERTINENT HISTORY:  HYSTEROSCOPY WITH D & C, CESAREAN SECTION x3,  Sexual abuse: No  BOWEL MOVEMENT: No concerns, does have a little increased urgency since gallbladder removal.   URINATION: Pain with urination: No Fully empty bladder: No Stream: Strong and Weak Urgency: Yes  Frequency: at least every 2 hours during the day and 2x nightly Leakage: Urge to void, Walking to the bathroom, Coughing, Sneezing, Laughing, Exercise, Lifting, and Intercourse Pads: Yes: liners but they irritate skin so only wears them when out for while for sick  INTERCOURSE:  Ability to have vaginal penetration Yes  Pain with intercourse: none Dryness sometimes Climax: not painful Marinoff Scale: 0/3  PREGNANCY: Vaginal deliveries 0  C-section deliveries 3 Currently pregnant  No  PROLAPSE: Sometimes feels like she is swollen vaginally.    OBJECTIVE:  Note: Objective measures were completed at Evaluation unless otherwise noted.  DIAGNOSTIC FINDINGS:    COGNITION: Overall cognitive status: Within functional limits for tasks assessed     SENSATION: Light touch: Appears intact  LUMBAR SPECIAL TESTS:  Single leg stance test: pelvic rotation noted with single leg stance bil and hip drop bil  FUNCTIONAL TESTS:  Sit up test - 1/3  GAIT: WFL  POSTURE: rounded shoulders and posterior pelvic tilt   LUMBARAROM/PROM:  A/PROM A/PROM  eval  Flexion WFL  Extension WFL  Right lateral flexion Limited by 25%  Left lateral flexion Limited by 25%  Right rotation WFL  Left rotation WFL   (Blank rows = not tested)  LOWER EXTREMITY ROM:  Bil hamstrings and adductors limited by 25%  LOWER EXTREMITY MMT:  Bil hips grossly 4/5 PALPATION:   General: tightness to lumbar and thoracic paraspinals   Pelvic Alignment: WFL  Abdominal: no TTP, but does have profound tension throughout c-section scarring in all directions                External Perineal Exam: no TTP                             Internal Pelvic Floor: no TTP  Patient confirms identification and approves PT to assess internal pelvic floor and treatment Yes No emotional/communication barriers or cognitive limitation. Patient is motivated to learn. Patient understands and agrees with treatment goals and plan. PT explains patient will be examined in standing, sitting, and lying down to see how their muscles and joints work. When they are ready, they will be asked to remove their underwear so PT can examine their perineum. The patient is also given the option of providing their own chaperone as one is not provided in our facility. The patient also has the right and is explained the right to defer or refuse any part of the evaluation or treatment including the internal exam. With the patient's consent,  PT will use one gloved finger to gently assess the muscles of the  pelvic floor, seeing how well it contracts and relaxes and if there is muscle symmetry. After, the patient will get dressed and PT and patient will discuss exam findings and plan of care. PT and patient discuss plan of care, schedule, attendance policy and HEP activities.  PELVIC MMT:   MMT eval  Vaginal 0/5  Internal Anal Sphincter   External Anal Sphincter   Puborectalis   Diastasis Recti   (Blank rows = not tested)        TONE: mildly decreased   PROLAPSE: Not seen in hooklying with cough but limited visualization due to body habitus though denies all symptoms   TODAY'S TREATMENT:                                                                                                                              DATE:   11/02/23 EVAL Examination completed, findings reviewed, pt educated on POC, HEP, NMRE for pelvic floor activation however unable. Did attempt quick release, visualization, and coordination with breathing or core activation, pt unable to achieve pelvic floor activation. Pt motivated to participate in PT and agreeable to attempt recommendations.     PATIENT EDUCATION:  Education details: [id number] Person educated: Patient Education method: Explanation, Demonstration, Tactile cues, Verbal cues, and Handouts Education comprehension: verbalized understanding, returned demonstration, verbal cues required, tactile cues required, and needs further education  HOME EXERCISE PROGRAM: [id number]  ASSESSMENT:  CLINICAL IMPRESSION: Patient is a 48 y.o. female  who was seen today for physical therapy evaluation and treatment for urinary incontinence and weakness at pelvic floor. Pt found to have decreased core and hip strength, decreased flexibility in spine and hips which could be limiting pelvic mobility, decreased mobility at c-section scarring. Patient consented to internal pelvic floor assessment vaginally this date and  found to have decreased strength, endurance, and coordination. Extra time spent to attempt to improve activation of pelvic floor and pt unable at all. Pt would benefit from additional PT to further address deficits.    OBJECTIVE IMPAIRMENTS: decreased activity tolerance, decreased coordination, decreased endurance, decreased strength, impaired flexibility, improper body mechanics, and postural dysfunction.   ACTIVITY LIMITATIONS: carrying, lifting, squatting, and continence  PARTICIPATION LIMITATIONS: community activity, occupation, and yard work  PERSONAL FACTORS: Fitness and Time since onset of injury/illness/exacerbation are also affecting patient's functional outcome.   REHAB POTENTIAL: Good  CLINICAL DECISION MAKING: Stable/uncomplicated  EVALUATION COMPLEXITY: Low   GOALS: Goals reviewed with patient? Yes  SHORT TERM GOALS: Target date: 11/30/23  Pt to be I with HEP for carry over and continuing recommendations for improved outcomes.   Baseline: Goal status: INITIAL  2.  Pt to report no more than 1 instance of urinary incontinence in a day for improved QOL and confidence for community outings.  Baseline:  Goal status: INITIAL  3.  Pt to demonstrate activation of transverse abdominis with core and hip strengthening exercises for improved mechanics and decreased strain at pelvic floor.  Baseline:  Goal status: INITIAL  4.  Pt to be I with pressure management with lifting 5# without leakage to tolerate carrying groceries without leakage Baseline:  Goal status: INITIAL   LONG TERM GOALS: Target date: 05/04/24  Pt to be I with advanced HEP for carry over and continuing recommendations for improved outcomes.   Baseline:  Goal status: INITIAL  2.  Pt will have 75% less urgency due to bladder retraining and strengthening for improved ability to only void bladder every 2-4 hours without leakage for improved bladder health.  Baseline:  Goal status: INITIAL  3.  Pt to report  no more than one instance of urinary incontinence in a week for improved confidence with community outings.  Baseline:  Goal status: INITIAL  4.  Pt to demonstrate at least 3/5 pelvic floor strength for improved pelvic stability and decreased strain at pelvic floor/ decrease leakage.  Baseline:  Goal status: INITIAL  5.  Pt to demonstrate improved coordination of pelvic floor and breathing mechanics with 10# squat with appropriate synergistic patterns to decrease pain and leakage at least 75% of the time for improved ability to complete a 30 minute walk without strain at pelvic floor and symptoms.    Baseline:  Goal status: INITIAL  PLAN:  PT FREQUENCY: every other week  PT DURATION: 8 sessions  PLANNED INTERVENTIONS: 97110-Therapeutic exercises, 97530- Therapeutic activity, 97112- Neuromuscular re-education, 97535- Self Care, 02859- Manual therapy, [phone]- Electrical stimulation (manual), [phone] (1-2 muscles), [phone] (3+ muscles)- Dry Needling, Patient/Family education, Taping, Joint mobilization, Spinal mobilization, Scar mobilization, DME instructions, Cryotherapy, Moist heat, and Biofeedback  PLAN FOR NEXT SESSION: coordination of pelvic floor and breathing mechanics with strengthening exercise, edu on vaginal weights, retraining bladder    Darryle Navy, PT, DPT 11/01/2508:50 AM  [facility] [address] [place], [place] [address] Phone # [phone] Fax [phone]

## 2023-12-01 ENCOUNTER — Ambulatory Visit: Admitting: Physical Therapy

## 2023-12-15 ENCOUNTER — Ambulatory Visit: Attending: Obstetrics | Admitting: Physical Therapy

## 2023-12-15 DIAGNOSIS — N393 Stress incontinence (female) (male): Secondary | ICD-10-CM | POA: Insufficient documentation

## 2023-12-15 DIAGNOSIS — R279 Unspecified lack of coordination: Secondary | ICD-10-CM | POA: Insufficient documentation

## 2023-12-15 DIAGNOSIS — M6281 Muscle weakness (generalized): Secondary | ICD-10-CM | POA: Diagnosis present

## 2023-12-15 DIAGNOSIS — R293 Abnormal posture: Secondary | ICD-10-CM | POA: Insufficient documentation

## 2023-12-15 NOTE — Patient Instructions (Signed)

## 2023-12-15 NOTE — Therapy (Signed)
 OUTPATIENT PHYSICAL THERAPY FEMALE PELVIC EVALUATION   Patient Name: Brittney [name] MRN: [id number] DOB:[birth year], 48 y.o., female Today's Date: 12/15/2023  END OF SESSION:  PT End of Session - 12/15/23 0848     Visit Number 2    Date for PT Re-Evaluation 05/04/24    Authorization Type cigna    PT Start Time 0845    PT Stop Time 0926    PT Time Calculation (min) 41 min    Activity Tolerance Patient tolerated treatment well    Behavior During Therapy [facility] for tasks assessed/performed          Past Medical History:  Diagnosis Date   Anxiety    Depression    Family history of adverse reaction to anesthesia    MOM AND SISTER     History of chicken pox    Past Surgical History:  Procedure Laterality Date   CESAREAN SECTION     x3 (2004, 2007, 2011)   CHOLECYSTECTOMY  2004   HYSTEROSCOPY WITH D & C N/A 07/18/2017   Procedure: DILATATION AND CURETTAGE /HYSTEROSCOPY;  Surgeon: Mat Browning, MD;  Location: [facility];  Service: Gynecology;  Laterality: N/A;   INTRAUTERINE DEVICE (IUD) INSERTION N/A 07/18/2017   Procedure: INTRAUTERINE DEVICE (IUD) INSERTION, ultrasound guidance;  Surgeon: Mat Browning, MD;  Location: [facility];  Service: Gynecology;  Laterality: N/A;   Patient Active Problem List   Diagnosis Date Noted   Nocturnal leg cramps 09/20/2023   Paresthesia 09/20/2023   Hyperlipidemia 06/08/2023   Prediabetes 06/08/2023   Chronic pain of both knees 06/08/2023   Obesity, Class III, BMI 40-49.9 (morbid obesity) 09/27/2019   Anxiety and depression 09/27/2019   Psychophysiological insomnia 09/27/2019    PCP: Chandra Toribio POUR, MD   REFERRING PROVIDER: Kandyce Sor, MD  REFERRING DIAG: N39.3 (ICD-10-CM) - Stress incontinence (female) (female)  THERAPY DIAG:  Muscle weakness (generalized)  Stress incontinence  Abnormal posture  Unspecified lack of coordination  Rationale for Evaluation and Treatment: Rehabilitation  ONSET DATE: chronic   SUBJECTIVE:                                                                                                                                                                                            SUBJECTIVE STATEMENT: Just the same with leakage, unsure if she's doing HEP correctly.   Fluid intake: water - 32oz (at least sometimes a little more), tea sometimes with dinner but nothing often  PAIN:  PAIN:  Are you having pain? Yes NPRS scale: 0-4/10 Pain location: mid back PAIN TYPE: aching Pain description: intermittent  Aggravating factors: random, feels like she can't hold  up right Relieving factors: changing positions    PRECAUTIONS: None  RED FLAGS: None   WEIGHT BEARING RESTRICTIONS: No  FALLS:  Has patient fallen in last 6 months? No  OCCUPATION: Gaffer   ACTIVITY LEVEL : low   PLOF: Independent  PATIENT GOALS: to have less leakage  PERTINENT HISTORY:  HYSTEROSCOPY WITH D & C, CESAREAN SECTION x3,  Sexual abuse: No  BOWEL MOVEMENT: No concerns, does have a little increased urgency since gallbladder removal.   URINATION: Pain with urination: No Fully empty bladder: No Stream: Strong and Weak Urgency: Yes  Frequency: at least every 2 hours during the day and 2x nightly Leakage: Urge to void, Walking to the bathroom, Coughing, Sneezing, Laughing, Exercise, Lifting, and Intercourse Pads: Yes: liners but they irritate skin so only wears them when out for while for sick  INTERCOURSE:  Ability to have vaginal penetration Yes  Pain with intercourse: none Dryness sometimes Climax: not painful Marinoff Scale: 0/3  PREGNANCY: Vaginal deliveries 0  C-section deliveries 3 Currently pregnant No  PROLAPSE: Sometimes feels like she is swollen vaginally.    OBJECTIVE:  Note: Objective measures were completed at Evaluation unless otherwise noted.  DIAGNOSTIC FINDINGS:    COGNITION: Overall cognitive status: Within functional limits for tasks  assessed     SENSATION: Light touch: Appears intact  LUMBAR SPECIAL TESTS:  Single leg stance test: pelvic rotation noted with single leg stance bil and hip drop bil  FUNCTIONAL TESTS:  Sit up test - 1/3  GAIT: WFL  POSTURE: rounded shoulders and posterior pelvic tilt   LUMBARAROM/PROM:  A/PROM A/PROM  eval  Flexion WFL  Extension WFL  Right lateral flexion Limited by 25%  Left lateral flexion Limited by 25%  Right rotation WFL  Left rotation WFL   (Blank rows = not tested)  LOWER EXTREMITY ROM:  Bil hamstrings and adductors limited by 25%  LOWER EXTREMITY MMT:  Bil hips grossly 4/5 PALPATION:   General: tightness to lumbar and thoracic paraspinals   Pelvic Alignment: WFL  Abdominal: no TTP, but does have profound tension throughout c-section scarring in all directions                External Perineal Exam: no TTP                             Internal Pelvic Floor: no TTP  Patient confirms identification and approves PT to assess internal pelvic floor and treatment Yes No emotional/communication barriers or cognitive limitation. Patient is motivated to learn. Patient understands and agrees with treatment goals and plan. PT explains patient will be examined in standing, sitting, and lying down to see how their muscles and joints work. When they are ready, they will be asked to remove their underwear so PT can examine their perineum. The patient is also given the option of providing their own chaperone as one is not provided in our facility. The patient also has the right and is explained the right to defer or refuse any part of the evaluation or treatment including the internal exam. With the patient's consent, PT will use one gloved finger to gently assess the muscles of the pelvic floor, seeing how well it contracts and relaxes and if there is muscle symmetry. After, the patient will get dressed and PT and patient will discuss exam findings and plan of care. PT and  patient discuss plan of care, schedule, attendance policy and HEP activities.  PELVIC MMT:   MMT eval  Vaginal 0/5  Internal Anal Sphincter   External Anal Sphincter   Puborectalis   Diastasis Recti   (Blank rows = not tested)        TONE: mildly decreased   PROLAPSE: Not seen in hooklying with cough but limited visualization due to body habitus though denies all symptoms   TODAY'S TREATMENT:                                                                                                                              DATE:   11/02/23 EVAL Examination completed, findings reviewed, pt educated on POC, HEP, NMRE for pelvic floor activation however unable. Did attempt quick release, visualization, and coordination with breathing or core activation, pt unable to achieve pelvic floor activation. Pt motivated to participate in PT and agreeable to attempt recommendations.     9/4/5: Pt educated on urge drill and bladder irritants, HEP updated and pt given all handouts. Added HEP completed during session NMRE: all exercises for exhale and pelvic floor contraction with activity for improved coordination of muscle activation and decreased stress at pelvic floor for decreased leakage.   2x10 transverse abdominis activation with exhale - max tactile and verbal cues - improved with ball squeezes between hands 2x10 bridges with ball squeeze and diaphragmatic breathing - moderate cues for coordination  2x10 opposite hand/knee ball press with exhale 2x10 blue band hooklying hip abduction with exhale and transverse abdominis activation  2x10 blue band hooklying alt hip flexion with exhale and transverse abdominis activation  Hooklying blue band bil shoulder horizontal abduction with transverse abdominis activation and exhale 2x10    PATIENT EDUCATION:  Education details: [id number] Person educated: Patient Education method: Explanation, Demonstration, Tactile cues, Verbal cues, and  Handouts Education comprehension: verbalized understanding, returned demonstration, verbal cues required, tactile cues required, and needs further education  HOME EXERCISE PROGRAM: [id number]  ASSESSMENT:  CLINICAL IMPRESSION: Patient is a 48 y.o. female  who was seen today for physical therapy treatment for urinary incontinence and weakness at pelvic floor. Pt continues to have similar symptoms to  eval. Pt reports difficulty with feeling any mobility at pelvic floor. Session focused on NMRE for coordination pelvic floor, breathing, and exercises for improved activation of pelvic floor to decreased urinary incontinence. HEP also updated and urge drill given for nighttime frequency. Pt would benefit from additional PT to further address deficits.    OBJECTIVE IMPAIRMENTS: decreased activity tolerance, decreased coordination, decreased endurance, decreased strength, impaired flexibility, improper body mechanics, and postural dysfunction.   ACTIVITY LIMITATIONS: carrying, lifting, squatting, and continence  PARTICIPATION LIMITATIONS: community activity, occupation, and yard work  PERSONAL FACTORS: Fitness and Time since onset of injury/illness/exacerbation are also affecting patient's functional outcome.   REHAB POTENTIAL: Good  CLINICAL DECISION MAKING: Stable/uncomplicated  EVALUATION COMPLEXITY: Low   GOALS: Goals reviewed with patient? Yes  SHORT TERM GOALS: Target date: 11/30/23  Pt to be I with HEP  for carry over and continuing recommendations for improved outcomes.   Baseline: Goal status: on going  2.  Pt to report no more than 1 instance of urinary incontinence in a day for improved QOL and confidence for community outings.  Baseline:  Goal status: on going  3.  Pt to demonstrate activation of transverse abdominis with core and hip strengthening exercises for improved mechanics and decreased strain at pelvic floor.  Baseline:  Goal status: on going  4.  Pt to be I with  pressure management with lifting 5# without leakage to tolerate carrying groceries without leakage Baseline:  Goal status: on going   LONG TERM GOALS: Target date: 05/04/24  Pt to be I with advanced HEP for carry over and continuing recommendations for improved outcomes.   Baseline:  Goal status: on going  2.  Pt will have 75% less urgency due to bladder retraining and strengthening for improved ability to only void bladder every 2-4 hours without leakage for improved bladder health.  Baseline:  Goal status: on going  3.  Pt to report no more than one instance of urinary incontinence in a week for improved confidence with community outings.  Baseline:  Goal status: on going  4.  Pt to demonstrate at least 3/5 pelvic floor strength for improved pelvic stability and decreased strain at pelvic floor/ decrease leakage.  Baseline:  Goal status: on going  5.  Pt to demonstrate improved coordination of pelvic floor and breathing mechanics with 10# squat with appropriate synergistic patterns to decrease pain and leakage at least 75% of the time for improved ability to complete a 30 minute walk without strain at pelvic floor and symptoms.    Baseline:  Goal status: on going  PLAN:  PT FREQUENCY: every other week  PT DURATION: 8 sessions  PLANNED INTERVENTIONS: 97110-Therapeutic exercises, 97530- Therapeutic activity, 97112- Neuromuscular re-education, 97535- Self Care, 02859- Manual therapy, [phone]- Electrical stimulation (manual), [phone] (1-2 muscles), 20561 (3+ muscles)- Dry Needling, Patient/Family education, Taping, Joint mobilization, Spinal mobilization, Scar mobilization, DME instructions, Cryotherapy, Moist heat, and Biofeedback  PLAN FOR NEXT SESSION: coordination of pelvic floor and breathing mechanics with strengthening exercise, edu on vaginal weights, retraining bladder    Darryle Navy, PT, DPT 12/14/2508:37 AM  [facility] [address]  [address] [place], [place] [address] Phone # [phone] Fax [phone]

## 2024-01-18 ENCOUNTER — Ambulatory Visit: Attending: Obstetrics | Admitting: Physical Therapy

## 2024-01-18 DIAGNOSIS — M6281 Muscle weakness (generalized): Secondary | ICD-10-CM | POA: Insufficient documentation

## 2024-01-18 DIAGNOSIS — R293 Abnormal posture: Secondary | ICD-10-CM | POA: Insufficient documentation

## 2024-01-18 DIAGNOSIS — R279 Unspecified lack of coordination: Secondary | ICD-10-CM | POA: Insufficient documentation

## 2024-01-18 DIAGNOSIS — N393 Stress incontinence (female) (male): Secondary | ICD-10-CM | POA: Diagnosis present

## 2024-01-18 NOTE — Therapy (Signed)
 OUTPATIENT PHYSICAL THERAPY FEMALE PELVIC EVALUATION   Patient Name: Brittney [name] MRN: [id number] DOB:05-Sep-1975, 48 y.o., female Today's Date: 01/18/2024  END OF SESSION:  PT End of Session - 01/18/24 1104     Visit Number 3    Date for Recertification  05/04/24    Authorization Type cigna    PT Start Time 1101    PT Stop Time 1141    PT Time Calculation (min) 40 min    Activity Tolerance Patient tolerated treatment well    Behavior During Therapy [facility] for tasks assessed/performed           Past Medical History:  Diagnosis Date   Anxiety    Depression    Family history of adverse reaction to anesthesia    MOM AND SISTER     History of chicken pox    Past Surgical History:  Procedure Laterality Date   CESAREAN SECTION     x3 (2004, 2007, 2011)   CHOLECYSTECTOMY  2004   HYSTEROSCOPY WITH D & C N/A 07/18/2017   Procedure: DILATATION AND CURETTAGE /HYSTEROSCOPY;  Surgeon: Mat Browning, MD;  Location: [facility];  Service: Gynecology;  Laterality: N/A;   INTRAUTERINE DEVICE (IUD) INSERTION N/A 07/18/2017   Procedure: INTRAUTERINE DEVICE (IUD) INSERTION, ultrasound guidance;  Surgeon: Mat Browning, MD;  Location: [facility];  Service: Gynecology;  Laterality: N/A;   Patient Active Problem List   Diagnosis Date Noted   Nocturnal leg cramps 09/20/2023   Paresthesia 09/20/2023   Hyperlipidemia 06/08/2023   Prediabetes 06/08/2023   Chronic pain of both knees 06/08/2023   Obesity, Class III, BMI 40-49.9 (morbid obesity) (HCC) 09/27/2019   Anxiety and depression 09/27/2019   Psychophysiological insomnia 09/27/2019    PCP: Chandra Toribio POUR, MD   REFERRING PROVIDER: Kandyce Sor, MD  REFERRING DIAG: N39.3 (ICD-10-CM) - Stress incontinence (female) (female)  THERAPY DIAG:  Muscle weakness (generalized)  Stress incontinence  Abnormal posture  Unspecified lack of coordination  Rationale for Evaluation and Treatment: Rehabilitation  ONSET DATE: chronic    SUBJECTIVE:                                                                                                                                                                                           SUBJECTIVE STATEMENT: Is trying urge drill and still has to cross legs to achieve anything, and doesn't always help. Still unsure if she is doing pelvic floor contractoins  Fluid intake: water - 32oz (at least sometimes a little more), tea sometimes with dinner but nothing often  PAIN:  PAIN:  Are you having pain? Yes NPRS scale: 0-4/10 Pain location: mid  back PAIN TYPE: aching Pain description: intermittent  Aggravating factors: random, feels like she can't hold up right Relieving factors: changing positions    PRECAUTIONS: None  RED FLAGS: None   WEIGHT BEARING RESTRICTIONS: No  FALLS:  Has patient fallen in last 6 months? No  OCCUPATION: Gaffer   ACTIVITY LEVEL : low   PLOF: Independent  PATIENT GOALS: to have less leakage  PERTINENT HISTORY:  HYSTEROSCOPY WITH D & C, CESAREAN SECTION x3,  Sexual abuse: No  BOWEL MOVEMENT: No concerns, does have a little increased urgency since gallbladder removal.   URINATION: Pain with urination: No Fully empty bladder: No Stream: Strong and Weak Urgency: Yes  Frequency: at least every 2 hours during the day and 2x nightly Leakage: Urge to void, Walking to the bathroom, Coughing, Sneezing, Laughing, Exercise, Lifting, and Intercourse Pads: Yes: liners but they irritate skin so only wears them when out for while for sick  INTERCOURSE:  Ability to have vaginal penetration Yes  Pain with intercourse: none Dryness sometimes Climax: not painful Marinoff Scale: 0/3  PREGNANCY: Vaginal deliveries 0  C-section deliveries 3 Currently pregnant No  PROLAPSE: Sometimes feels like she is swollen vaginally.    OBJECTIVE:  Note: Objective measures were completed at Evaluation unless otherwise noted.  DIAGNOSTIC  FINDINGS:    COGNITION: Overall cognitive status: Within functional limits for tasks assessed     SENSATION: Light touch: Appears intact  LUMBAR SPECIAL TESTS:  Single leg stance test: pelvic rotation noted with single leg stance bil and hip drop bil  FUNCTIONAL TESTS:  Sit up test - 1/3  GAIT: WFL  POSTURE: rounded shoulders and posterior pelvic tilt   LUMBARAROM/PROM:  A/PROM A/PROM  eval  Flexion WFL  Extension WFL  Right lateral flexion Limited by 25%  Left lateral flexion Limited by 25%  Right rotation WFL  Left rotation WFL   (Blank rows = not tested)  LOWER EXTREMITY ROM:  Bil hamstrings and adductors limited by 25%  LOWER EXTREMITY MMT:  Bil hips grossly 4/5 PALPATION:   General: tightness to lumbar and thoracic paraspinals   Pelvic Alignment: WFL  Abdominal: no TTP, but does have profound tension throughout c-section scarring in all directions                External Perineal Exam: no TTP                             Internal Pelvic Floor: no TTP  Patient confirms identification and approves PT to assess internal pelvic floor and treatment Yes No emotional/communication barriers or cognitive limitation. Patient is motivated to learn. Patient understands and agrees with treatment goals and plan. PT explains patient will be examined in standing, sitting, and lying down to see how their muscles and joints work. When they are ready, they will be asked to remove their underwear so PT can examine their perineum. The patient is also given the option of providing their own chaperone as one is not provided in our facility. The patient also has the right and is explained the right to defer or refuse any part of the evaluation or treatment including the internal exam. With the patient's consent, PT will use one gloved finger to gently assess the muscles of the pelvic floor, seeing how well it contracts and relaxes and if there is muscle symmetry. After, the patient  will get dressed and PT and patient will discuss exam findings and  plan of care. PT and patient discuss plan of care, schedule, attendance policy and HEP activities.  PELVIC MMT:   MMT eval  Vaginal 0/5  Internal Anal Sphincter   External Anal Sphincter   Puborectalis   Diastasis Recti   (Blank rows = not tested)        TONE: mildly decreased   PROLAPSE: Not seen in hooklying with cough but limited visualization due to body habitus though denies all symptoms   TODAY'S TREATMENT:                                                                                                                              DATE:   11/02/23 EVAL Examination completed, findings reviewed, pt educated on POC, HEP, NMRE for pelvic floor activation however unable. Did attempt quick release, visualization, and coordination with breathing or core activation, pt unable to achieve pelvic floor activation. Pt motivated to participate in PT and agreeable to attempt recommendations.     9/4/5: Pt educated on urge drill and bladder irritants, HEP updated and pt given all handouts. Added HEP completed during session NMRE: all exercises for exhale and pelvic floor contraction with activity for improved coordination of muscle activation and decreased stress at pelvic floor for decreased leakage.   2x10 transverse abdominis activation with exhale - max tactile and verbal cues - improved with ball squeezes between hands 2x10 bridges with ball squeeze and diaphragmatic breathing - moderate cues for coordination  2x10 opposite hand/knee ball press with exhale 2x10 blue band hooklying hip abduction with exhale and transverse abdominis activation  2x10 blue band hooklying alt hip flexion with exhale and transverse abdominis activation  Hooklying blue band bil shoulder horizontal abduction with transverse abdominis activation and exhale 2x10  01/18/24: Patient consented to internal pelvic floor treatment vaginally this date and  found to have continued decreased strength, endurance, and coordination. Patient benefited from verbal cues for improved technique with pelvic floor contractions. But able to achieve up to 1-2/5 with muscle tapping then further improved with ball squeeze at hands and increased 3/5. Does still have decreased fatigue with increased reps.  Updated HEP and reviewed with pt    PATIENT EDUCATION:  Education details: [id number] Person educated: Patient Education method: Explanation, Demonstration, Tactile cues, Verbal cues, and Handouts Education comprehension: verbalized understanding, returned demonstration, verbal cues required, tactile cues required, and needs further education  HOME EXERCISE PROGRAM: [id number]  ASSESSMENT:  CLINICAL IMPRESSION: Patient is a 48 y.o. female  who was seen today for physical therapy treatment for urinary incontinence and weakness at pelvic floor. Pt consented to internal today and with NMRE techniques and extra time able to achieve up to 3/5 strength but is getting pelvic floor contractions consistently. Pt would benefit from additional PT to further address deficits.    OBJECTIVE IMPAIRMENTS: decreased activity tolerance, decreased coordination, decreased endurance, decreased strength, impaired flexibility, improper body mechanics, and postural dysfunction.   ACTIVITY LIMITATIONS: carrying, lifting, squatting,  and continence  PARTICIPATION LIMITATIONS: community activity, occupation, and yard work  PERSONAL FACTORS: Fitness and Time since onset of injury/illness/exacerbation are also affecting patient's functional outcome.   REHAB POTENTIAL: Good  CLINICAL DECISION MAKING: Stable/uncomplicated  EVALUATION COMPLEXITY: Low   GOALS: Goals reviewed with patient? Yes  SHORT TERM GOALS: Target date: 11/30/23  Pt to be I with HEP for carry over and continuing recommendations for improved outcomes.   Baseline: Goal status: on going  2.  Pt to report no more  than 1 instance of urinary incontinence in a day for improved QOL and confidence for community outings.  Baseline:  Goal status: on going  3.  Pt to demonstrate activation of transverse abdominis with core and hip strengthening exercises for improved mechanics and decreased strain at pelvic floor.  Baseline:  Goal status: on going  4.  Pt to be I with pressure management with lifting 5# without leakage to tolerate carrying groceries without leakage Baseline:  Goal status: on going   LONG TERM GOALS: Target date: 05/04/24  Pt to be I with advanced HEP for carry over and continuing recommendations for improved outcomes.   Baseline:  Goal status: on going  2.  Pt will have 75% less urgency due to bladder retraining and strengthening for improved ability to only void bladder every 2-4 hours without leakage for improved bladder health.  Baseline:  Goal status: on going  3.  Pt to report no more than one instance of urinary incontinence in a week for improved confidence with community outings.  Baseline:  Goal status: on going  4.  Pt to demonstrate at least 3/5 pelvic floor strength for improved pelvic stability and decreased strain at pelvic floor/ decrease leakage.  Baseline:  Goal status: on going  5.  Pt to demonstrate improved coordination of pelvic floor and breathing mechanics with 10# squat with appropriate synergistic patterns to decrease pain and leakage at least 75% of the time for improved ability to complete a 30 minute walk without strain at pelvic floor and symptoms.    Baseline:  Goal status: on going  PLAN:  PT FREQUENCY: every other week  PT DURATION: 8 sessions  PLANNED INTERVENTIONS: 97110-Therapeutic exercises, 97530- Therapeutic activity, 97112- Neuromuscular re-education, 97535- Self Care, [id number]- Manual therapy, [phone]- Electrical stimulation (manual), [phone] (1-2 muscles), [phone] (3+ muscles)- Dry Needling, Patient/Family education, Taping, Joint mobilization,  Spinal mobilization, Scar mobilization, DME instructions, Cryotherapy, Moist heat, and Biofeedback  PLAN FOR NEXT SESSION: coordination of pelvic floor and breathing mechanics with strengthening exercise, edu on vaginal weights, retraining bladder    Darryle Navy, PT, DPT 01/18/2511:36 PM  [facility] [address], [address] [place], [place] [address] Phone # [phone] Fax [phone]

## 2024-02-01 ENCOUNTER — Encounter: Admitting: Physical Therapy

## 2024-03-15 ENCOUNTER — Ambulatory Visit: Admitting: Physical Therapy
